# Patient Record
Sex: Male | Born: 1940 | Race: White | Hispanic: No | Marital: Single | Smoking: Current every day smoker
Health system: Southern US, Community
[De-identification: ages and names within clinical notes are randomized; demographics above are authoritative.]

## PROBLEM LIST (undated history)

## (undated) DIAGNOSIS — I639 Cerebral infarction, unspecified: Secondary | ICD-10-CM

---

## 2013-03-31 ENCOUNTER — Encounter (HOSPITAL_COMMUNITY): Payer: Self-pay | Admitting: Emergency Medicine

## 2013-03-31 ENCOUNTER — Emergency Department (HOSPITAL_COMMUNITY)
Admission: EM | Admit: 2013-03-31 | Discharge: 2013-03-31 | Disposition: A | Discharge status: Home / Self Care | Payer: Medicare Other | Attending: Emergency Medicine | Admitting: Emergency Medicine

## 2013-03-31 DIAGNOSIS — K029 Dental caries, unspecified: Secondary | ICD-10-CM | POA: Insufficient documentation

## 2013-03-31 DIAGNOSIS — K089 Disorder of teeth and supporting structures, unspecified: Secondary | ICD-10-CM | POA: Insufficient documentation

## 2013-03-31 DIAGNOSIS — F17200 Nicotine dependence, unspecified, uncomplicated: Secondary | ICD-10-CM | POA: Insufficient documentation

## 2013-03-31 DIAGNOSIS — K0889 Other specified disorders of teeth and supporting structures: Secondary | ICD-10-CM

## 2013-03-31 MED ORDER — AMOXICILLIN 500 MG PO CAPS: 500.0000 mg | ORAL_CAPSULE | Freq: Three times a day (TID) | ORAL | Status: DC

## 2013-03-31 NOTE — ED Provider Notes (Signed)
CSN: [id number]     Arrival date & time 03/31/13  1501 History  This chart was scribed for non-physician practitioner, Marlon Peliffany Jayleana Colberg, PA-C working with Rolland PorterMark James, MD by Greggory StallionKayla Andersen, ED scribe. This patient was seen in room WTR5/WTR5 and the patient's care was started at 5:09 PM.   Chief Complaint  Patient presents with  . Dental Pain   The history is provided by the patient. No language interpreter was used.   HPI Comments: Jim Payne is a 73 y.o. male who presents to the Emergency Department complaining of gradual onset, constant left upper dental pain that started 3 days ago. He states he has an abscessed tooth and history of the same. Pt states antibiotics normally get rid of the abscess and pain. He went to the pharmacy to get his prescription that helped him last time and requests this medication: Amoxicillin 500mg  tabs.  Denies fever, weakness, emesis, trouble swallowing.   History reviewed. No pertinent past medical history. History reviewed. No pertinent past surgical history. History reviewed. No pertinent family history. History  Substance Use Topics  . Smoking status: Current Every Day Smoker  . Smokeless tobacco: Not on file  . Alcohol Use: Yes    Review of Systems  Constitutional: Negative for fever.  HENT: Positive for dental problem. Negative for trouble swallowing.   Gastrointestinal: Negative for vomiting.  Neurological: Negative for weakness.  All other systems reviewed and are negative.    Allergies  Review of patient's allergies indicates not on file.  Home Medications   Current Outpatient Rx  Name  Route  Sig  Dispense  Refill  . amoxicillin (AMOXIL) 500 MG capsule   Oral   Take 1 capsule (500 mg total) by mouth 3 (three) times daily.   21 capsule   0     BP 137/65  Pulse 85  Temp(Src) 98.1 F (36.7 C) (Oral)  Resp 16  SpO2 99%  Physical Exam  Nursing note and vitals reviewed. Constitutional: He is oriented to person, place, and time.  He appears well-developed and well-nourished. No distress.  HENT:  Head: Normocephalic and atraumatic.  Mouth/Throat: Dental caries present.    Widespread dental decay. No obvious abscess.   Eyes: Conjunctivae and EOM are normal. Pupils are equal, round, and reactive to light.  Neck: Normal range of motion. Neck supple. No tracheal deviation present.  Cardiovascular: Normal rate and regular rhythm.   Pulmonary/Chest: Effort normal and breath sounds normal. No respiratory distress.  Musculoskeletal: Normal range of motion.  Neurological: He is alert and oriented to person, place, and time.  Skin: Skin is warm and dry.  Psychiatric: He has a normal mood and affect. His behavior is normal.    ED Course  Procedures (including critical care time)  DIAGNOSTIC STUDIES: Oxygen Saturation is 99% on RA, normal by my interpretation.    COORDINATION OF CARE: 5:11 PM-Discussed treatment plan which includes an antibiotic with pt at bedside and pt agreed to plan.   Labs Review Labs Reviewed - No data to display Imaging Review No results found.  EKG Interpretation   None       MDM   1. Toothache    Patient has dental pain. No emergent s/sx's present. Patent airway. No trismus.  Will be given  antibiotics. I discussed the need to call dentist within 24/48 hours for follow-up. Dental referral given. Return to ED precautions given.  Pt voiced understanding and has agreed to follow-up.   73 y.o.Jim Payne's evaluation in the  Emergency Department is complete. It has been determined that no acute conditions requiring further emergency intervention are present at this time. The patient/guardian have been advised of the diagnosis and plan. We have discussed signs and symptoms that warrant return to the ED, such as changes or worsening in symptoms.  Vital signs are stable at discharge. Filed Vitals:   03/31/13 1511  BP: 137/65  Pulse: 85  Temp: 98.1 F (36.7 C)  Resp: 16     Patient/guardian has voiced understanding and agreed to follow-up with the PCP or specialist.  I personally performed the services described in this documentation, which was scribed in my presence. The recorded information has been reviewed and is accurate.    Dorthula Matas, PA-C 03/31/13 1752

## 2013-03-31 NOTE — ED Notes (Signed)
Pt states he has had a dental abscess for past 3 days. Pt states he has had abscess in same tooth before. Pt has dental carie on L upper tooth.

## 2013-03-31 NOTE — ED Notes (Signed)
Patient is here with c/o tooth abscess in left upper tooth. Patient states he was treated with antbiotics recently. Patient has med bottles and list of what the pharmacy at [facility] told him to ask for. Patient states he has not been to dentist because he cant afford it.

## 2013-03-31 NOTE — Discharge Instructions (Signed)

## 2013-04-10 NOTE — ED Provider Notes (Signed)
Medical screening examination/treatment/procedure(s) were performed by non-physician practitioner and as supervising physician I was immediately available for consultation/collaboration.  EKG Interpretation   None         Amilia Vandenbrink, MD 04/10/13 0020 

## 2016-11-23 ENCOUNTER — Inpatient Hospital Stay (HOSPITAL_COMMUNITY)
Admission: EM | Admit: 2016-11-23 | Discharge: 2016-11-25 | DRG: 066 | Disposition: A | Discharge status: Home / Self Care | Payer: Non-veteran care | Attending: Internal Medicine | Admitting: Internal Medicine

## 2016-11-23 ENCOUNTER — Emergency Department (HOSPITAL_COMMUNITY): Payer: Non-veteran care

## 2016-11-23 ENCOUNTER — Inpatient Hospital Stay (HOSPITAL_COMMUNITY): Payer: Non-veteran care

## 2016-11-23 ENCOUNTER — Encounter (HOSPITAL_COMMUNITY): Payer: Self-pay | Admitting: *Deleted

## 2016-11-23 DIAGNOSIS — R29700 NIHSS score 0: Secondary | ICD-10-CM | POA: Diagnosis present

## 2016-11-23 DIAGNOSIS — I63332 Cerebral infarction due to thrombosis of left posterior cerebral artery: Secondary | ICD-10-CM | POA: Diagnosis not present

## 2016-11-23 DIAGNOSIS — R0781 Pleurodynia: Secondary | ICD-10-CM | POA: Diagnosis present

## 2016-11-23 DIAGNOSIS — I63212 Cerebral infarction due to unspecified occlusion or stenosis of left vertebral arteries: Secondary | ICD-10-CM

## 2016-11-23 DIAGNOSIS — I6522 Occlusion and stenosis of left carotid artery: Secondary | ICD-10-CM | POA: Diagnosis present

## 2016-11-23 DIAGNOSIS — G319 Degenerative disease of nervous system, unspecified: Secondary | ICD-10-CM | POA: Diagnosis present

## 2016-11-23 DIAGNOSIS — R200 Anesthesia of skin: Secondary | ICD-10-CM

## 2016-11-23 DIAGNOSIS — I739 Peripheral vascular disease, unspecified: Secondary | ICD-10-CM | POA: Diagnosis present

## 2016-11-23 DIAGNOSIS — R9082 White matter disease, unspecified: Secondary | ICD-10-CM | POA: Diagnosis present

## 2016-11-23 DIAGNOSIS — I63312 Cerebral infarction due to thrombosis of left middle cerebral artery: Secondary | ICD-10-CM

## 2016-11-23 DIAGNOSIS — I10 Essential (primary) hypertension: Secondary | ICD-10-CM | POA: Diagnosis present

## 2016-11-23 DIAGNOSIS — R079 Chest pain, unspecified: Secondary | ICD-10-CM

## 2016-11-23 DIAGNOSIS — I5030 Unspecified diastolic (congestive) heart failure: Secondary | ICD-10-CM | POA: Diagnosis not present

## 2016-11-23 DIAGNOSIS — R911 Solitary pulmonary nodule: Secondary | ICD-10-CM | POA: Diagnosis present

## 2016-11-23 DIAGNOSIS — F1021 Alcohol dependence, in remission: Secondary | ICD-10-CM | POA: Diagnosis present

## 2016-11-23 DIAGNOSIS — F17200 Nicotine dependence, unspecified, uncomplicated: Secondary | ICD-10-CM | POA: Diagnosis present

## 2016-11-23 DIAGNOSIS — I6381 Other cerebral infarction due to occlusion or stenosis of small artery: Secondary | ICD-10-CM

## 2016-11-23 DIAGNOSIS — I638 Other cerebral infarction: Secondary | ICD-10-CM | POA: Diagnosis present

## 2016-11-23 DIAGNOSIS — I6523 Occlusion and stenosis of bilateral carotid arteries: Secondary | ICD-10-CM | POA: Diagnosis not present

## 2016-11-23 DIAGNOSIS — I6389 Other cerebral infarction: Secondary | ICD-10-CM

## 2016-11-23 DIAGNOSIS — F1410 Cocaine abuse, uncomplicated: Secondary | ICD-10-CM | POA: Diagnosis present

## 2016-11-23 DIAGNOSIS — I6322 Cerebral infarction due to unspecified occlusion or stenosis of basilar arteries: Secondary | ICD-10-CM

## 2016-11-23 DIAGNOSIS — E785 Hyperlipidemia, unspecified: Secondary | ICD-10-CM | POA: Diagnosis present

## 2016-11-23 DIAGNOSIS — Z720 Tobacco use: Secondary | ICD-10-CM | POA: Diagnosis not present

## 2016-11-23 DIAGNOSIS — I639 Cerebral infarction, unspecified: Secondary | ICD-10-CM | POA: Diagnosis present

## 2016-11-23 LAB — APTT: aPTT: 33 seconds (ref 24–36)

## 2016-11-23 LAB — COMPREHENSIVE METABOLIC PANEL
ALT: 13 U/L — AB (ref 17–63)
AST: 22 U/L (ref 15–41)
Albumin: 4.1 g/dL (ref 3.5–5.0)
Alkaline Phosphatase: 62 U/L (ref 38–126)
Anion gap: 8 (ref 5–15)
BUN: 12 mg/dL (ref 6–20)
CHLORIDE: 104 mmol/L (ref 101–111)
CO2: 26 mmol/L (ref 22–32)
Calcium: 9.1 mg/dL (ref 8.9–10.3)
Creatinine, Ser: 1.18 mg/dL (ref 0.61–1.24)
GFR, EST NON AFRICAN AMERICAN: 59 mL/min — AB (ref >60)
Glucose, Bld: 112 mg/dL — ABNORMAL HIGH (ref 65–99)
Potassium: 4.6 mmol/L (ref 3.5–5.1)
Sodium: 138 mmol/L (ref 135–145)
Total Bilirubin: 0.8 mg/dL (ref 0.3–1.2)
Total Protein: 6.9 g/dL (ref 6.5–8.1)

## 2016-11-23 LAB — I-STAT CHEM 8, ED
BUN: 15 mg/dL (ref 6–20)
CREATININE: 1.1 mg/dL (ref 0.61–1.24)
Calcium, Ion: 1.1 mmol/L — ABNORMAL LOW (ref 1.15–1.40)
Chloride: 101 mmol/L (ref 101–111)
GLUCOSE: 107 mg/dL — AB (ref 65–99)
HCT: 46 % (ref 39.0–52.0)
HEMOGLOBIN: 15.6 g/dL (ref 13.0–17.0)
POTASSIUM: 4.6 mmol/L (ref 3.5–5.1)
Sodium: 140 mmol/L (ref 135–145)
TCO2: 28 mmol/L (ref 22–32)

## 2016-11-23 LAB — PROTIME-INR
INR: 0.91
Prothrombin Time: 12.2 seconds (ref 11.4–15.2)

## 2016-11-23 LAB — CBC
HEMATOCRIT: 44.8 % (ref 39.0–52.0)
HEMOGLOBIN: 14.3 g/dL (ref 13.0–17.0)
MCH: 27.6 pg (ref 26.0–34.0)
MCHC: 31.9 g/dL (ref 30.0–36.0)
MCV: 86.3 fL (ref 78.0–100.0)
Platelets: 202 10*3/uL (ref 150–400)
RBC: 5.19 MIL/uL (ref 4.22–5.81)
RDW: 14.3 % (ref 11.5–15.5)
WBC: 6.8 10*3/uL (ref 4.0–10.5)

## 2016-11-23 LAB — DIFFERENTIAL
BASOS PCT: 0 %
Basophils Absolute: 0 10*3/uL (ref 0.0–0.1)
Eosinophils Absolute: 0.1 10*3/uL (ref 0.0–0.7)
Eosinophils Relative: 1 %
LYMPHS ABS: 2.2 10*3/uL (ref 0.7–4.0)
Lymphocytes Relative: 32 %
MONO ABS: 0.4 10*3/uL (ref 0.1–1.0)
MONOS PCT: 6 %
Neutro Abs: 4.1 10*3/uL (ref 1.7–7.7)
Neutrophils Relative %: 61 %

## 2016-11-23 LAB — I-STAT TROPONIN, ED: TROPONIN I, POC: 0.01 ng/mL (ref 0.00–0.08)

## 2016-11-23 MED ORDER — SENNOSIDES-DOCUSATE SODIUM 8.6-50 MG PO TABS: 1.0000 | ORAL_TABLET | Freq: Every evening | ORAL | Status: DC | PRN

## 2016-11-23 MED ORDER — ASPIRIN 325 MG PO TABS: 325.0000 mg | ORAL_TABLET | Freq: Every day | ORAL | Status: DC

## 2016-11-23 MED ORDER — ASPIRIN 325 MG PO TABS
325.0000 mg | ORAL_TABLET | Freq: Every day | ORAL | Status: DC
  Administered 2016-11-24 – 2016-11-25 (×2): 325 mg via ORAL
  Filled 2016-11-23 (×2): qty 1

## 2016-11-23 MED ORDER — ACETAMINOPHEN 650 MG RE SUPP: 650.0000 mg | RECTAL | Status: DC | PRN

## 2016-11-23 MED ORDER — ENOXAPARIN SODIUM 40 MG/0.4ML ~~LOC~~ SOLN
40.0000 mg | SUBCUTANEOUS | Status: DC
  Administered 2016-11-23 – 2016-11-24 (×2): 40 mg via SUBCUTANEOUS
  Filled 2016-11-23 (×2): qty 0.4

## 2016-11-23 MED ORDER — ASPIRIN 300 MG RE SUPP: 300.0000 mg | Freq: Every day | RECTAL | Status: DC

## 2016-11-23 MED ORDER — STROKE: EARLY STAGES OF RECOVERY BOOK
Freq: Once | Status: DC
  Filled 2016-11-23: qty 1

## 2016-11-23 MED ORDER — ACETAMINOPHEN 325 MG PO TABS: 650.0000 mg | ORAL_TABLET | ORAL | Status: DC | PRN

## 2016-11-23 MED ORDER — ATORVASTATIN CALCIUM 80 MG PO TABS
80.0000 mg | ORAL_TABLET | Freq: Every day | ORAL | Status: DC
  Administered 2016-11-24 – 2016-11-25 (×2): 80 mg via ORAL
  Filled 2016-11-23 (×2): qty 1

## 2016-11-23 MED ORDER — ACETAMINOPHEN 160 MG/5ML PO SOLN: 650.0000 mg | ORAL | Status: DC | PRN

## 2016-11-23 NOTE — ED Notes (Signed)
ED Provider at bedside. 

## 2016-11-23 NOTE — ED Provider Notes (Signed)
[facility]EMERGENCY DEPT Provider Note   CSN: [id number] Arrival date & time: 11/23/16  1019     History   Chief Complaint Chief Complaint  Patient presents with  . Numbness    HPI Jim Payne is a 76 y.o. male.  The history is provided by the patient and medical records.     76 year old male here with numbness. States he first noticed this on Sunday, mostly along the right side of the face, right arm, and right leg. States it has not gotten any better but is also not gotten any worse. He denies any focal weakness, trouble walking, changes in speech, blurred vision, headache, or neck pain. States he recently started a new job and thought this was causing his symptoms as he has been moving around a lot more during the day than normal.  He denies any falls or head trauma. Not currently on anticoagulation.  He denies any significant headache or dizziness. No prior history of TIA or stroke.  States he had a CT scan done this morning at the [facility] hospital that said there was "blood" and he was sent here for further management.  History reviewed. No pertinent past medical history.  There are no active problems to display for this patient.   History reviewed. No pertinent surgical history.     Home Medications    Prior to Admission medications   Medication Sig Start Date End Date Taking? Authorizing Provider  amoxicillin (AMOXIL) 500 MG capsule Take 1 capsule (500 mg total) by mouth 3 (three) times daily. 03/31/13   Marlon Pel, PA-C    Family History History reviewed. No pertinent family history.  Social History Social History  Substance Use Topics  . Smoking status: Current Every Day Smoker  . Smokeless tobacco: Not on file  . Alcohol use Yes     Allergies   Patient has no known allergies.   Review of Systems Review of Systems  Neurological: Positive for numbness.  All other systems reviewed and are negative.    Physical Exam Updated Vital Signs BP (!) 163/107    Pulse 83   Temp 97.9 F (36.6 C) (Oral)   Resp 16   SpO2 100%   Physical Exam  Constitutional: He is oriented to person, place, and time. He appears well-developed and well-nourished.  HENT:  Head: Normocephalic and atraumatic.  Mouth/Throat: Oropharynx is clear and moist.  Eyes: Pupils are equal, round, and reactive to light. Conjunctivae and EOM are normal.  Neck: Normal range of motion.  Cardiovascular: Normal rate, regular rhythm and normal heart sounds.   Pulmonary/Chest: Effort normal and breath sounds normal. No respiratory distress. He has no wheezes.  Abdominal: Soft. Bowel sounds are normal. There is no tenderness. There is no rebound.  Musculoskeletal: Normal range of motion.  No cervical spine tenderness, no step-offs or deformities  Neurological: He is alert and oriented to person, place, and time.  AAOx3, answering questions and following commands appropriately; equal strength UE and LE bilaterally; CN grossly intact; moves all extremities appropriately without ataxia; decreased sensation of right cheek; right lateral forearm, right 4th and 5th fingers, right lateral thigh and right anterior ankle; normal sensation throughout left side; normal heel to shin, no pronator drift; no focal neuro deficits or facial asymmetry appreciated  Skin: Skin is warm and dry.  Psychiatric: He has a normal mood and affect.  Nursing note and vitals reviewed.    ED Treatments / Results  Labs (all labs ordered are listed, but only abnormal  results are displayed) Labs Reviewed  COMPREHENSIVE METABOLIC PANEL - Abnormal; Notable for the following:       Result Value   Glucose, Bld 112 (*)    ALT 13 (*)    GFR calc non Af Amer 59 (*)    All other components within normal limits  I-STAT CHEM 8, ED - Abnormal; Notable for the following:    Glucose, Bld 107 (*)    Calcium, Ion 1.10 (*)    All other components within normal limits  PROTIME-INR  APTT  CBC  DIFFERENTIAL  I-STAT TROPONIN,  ED  CBG MONITORING, ED    EKG  EKG Interpretation  Date/Time:  Wednesday November 23 2016 10:47:49 EDT Ventricular Rate:  85 PR Interval:  156 QRS Duration: 90 QT Interval:  370 QTC Calculation: 440 R Axis:   108 Text Interpretation:  Normal sinus rhythm Rightward axis Anterior infarct , age undetermined Abnormal ECG no pervious EKG  no acute ischemic changes  Confirmed by Crista CurbLiu, Dana [phone]([id number]) on 11/23/2016 12:18:20 PM       Radiology Ct Head Wo Contrast  Result Date: 11/23/2016 CLINICAL DATA:  Right arm numbness and tingling. EXAM: CT HEAD WITHOUT CONTRAST TECHNIQUE: Contiguous axial images were obtained from the base of the skull through the vertex without intravenous contrast. COMPARISON:  None. FINDINGS: Brain: No evidence of acute infarction, hemorrhage, hydrocephalus, extra-axial collection or mass lesion/mass effect. Bilateral basal ganglia lacunar infarcts. Age-related cerebral atrophy with compensatory dilatation of the ventricles. Periventricular white matter and corona radiata hypodensities favor chronic ischemic microvascular white matter disease. Vascular: Atherosclerotic vascular calcification of the carotid siphons. No hyperdense vessel. Skull:  No fracture or focal lesion. Sinuses/Orbits: The bilateral paranasal sinuses and mastoid air cells are clear. The orbits are unremarkable. Other: None. IMPRESSION: 1.  No acute intracranial abnormality. 2. 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 Electronically Signed   By: Obie DredgeWilliam T Derry M.D.   On: 11/23/2016 12:51    Procedures Procedures (including critical care time)  Medications Ordered in ED Medications - No data to display   Initial Impression / Assessment and Plan / ED Course  I have reviewed the triage vital signs and the nursing notes.  Pertinent labs & imaging results that were available during my care of the patient were reviewed by me and considered in my medical decision making  (see chart for details).  76 year old male here with numbness of his right side is been ongoing for 3 days now.  No worsening or improvement since onset.  He is afebrile and nontoxic. Does have some decreased sensation of the right cheek, right lateral forearm, right fourth and fifth digits, right lateral thigh, and right anterior ankle on exam. No other deficits noted. No ataxia.  Reports he had a [facility] this [facility] which showed a "bleed". Unfortunately, we cannot see these results results and he has no hard copies with him. Will need to repeat his head CT immediately.  1:33 PM Patient's head CT is negative for acute findings. Does have some chronic microvascular ischemia.  Labs are overall reassuring. Discussed results with patient, his knowledge understanding. Will obtain MRI. If negative, I feel he can be discharged home.  3:11 PM Patient awaiting MRI.  If negative, feel he can be discharged home to follow-up with PCP.  Patient seen and evaluated with attending physician, Dr. Verdie MosherLiu, who evaluated patient and agrees with assessment and plan of care.  Final Clinical Impressions(s) / ED  Diagnoses   Final diagnoses:  Numbness    New Prescriptions New Prescriptions   No medications on file     Garlon Hatchet, PA-C 11/23/16 1511    Lavera Guise, MD 11/23/16 [phone]

## 2016-11-23 NOTE — ED Triage Notes (Signed)
Pt reports having right side numbness since Saturday. [facility] today and had ct scan done, was told it was + for a stroke or bleeding, pt unsure of details and sent here for further eval and treatment. No acute distress is noted at triage.

## 2016-11-23 NOTE — ED Notes (Signed)
Pt ambulated to room from triage with NAD and steady gait. Pt reports having numbness on left side of face, arm, body, and leg since Saturday with no change. Pt denies weakness or dropping objects.

## 2016-11-23 NOTE — ED Notes (Addendum)
Pt reports he wants to go outside for some "fresh air" EDP made aware of pt attempting to leave.

## 2016-11-23 NOTE — H&P (Addendum)
History and Physical   Delois Tolbert ZOX:[id number] DOB: [birth year] DOA: 11/23/2016  PCP: System, Pcp Not In  Chief Complaint: Right-sided numbness  HPI: This 76 year old man reports that on 11-21-2016 he developed numbness involving his right side of his face and face his right fourth and fifth digit, his right lateral arm, and right distal lateral leg numbness. The symptoms have been constant, not aggravated or alleviated by anything. He denies any difficulty speaking, no difficulty moving any of his extremities, no difficulty swallowing. He reports a nonproductive cough over the past week or so, he has a longtime smoker with a 30-40-pack-year history of smoking. He reports never having any TIAs or strokes in the past, he presented to the [place] and according to his report was told that he had a stroke based on a CT scan.  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 She denies any hemoptysis, fevers, chest pain. He reports his only hospitalization has been an inguinal hernia repair of the left side.  ED Course: In the emergency department also has remarkable for elevated systolic blood pressures ranging from 155 to 170s, diastolics ranging in the 90s, respiratory rate of 23. BMP and CBC were unremarkable. He had a CT of the head performed which did not reveal any acute bleed, MRI of the brain revealed a subcentimeter acute infarction within the left lateral thalamus without acute hemorrhage. There is also report of moderate chronic microvascular ischemic changes and parenchymal volume loss the brain.  Review of Systems: A complete ROS was obtained; pertinent positives negatives are denoted in the HPI. Otherwise, all systems are negative.   PMH: -INGUINAL HERNIA REPAIR -SMOKING -PRIOR ALCOHOL AB USE  Social History   Social History  . Marital status: Single    Spouse name: N/A  . Number of children: N/A  . Years of  education: N/A   Occupational History  . Not on file.   Social History Main Topics  . Smoking status: Current Every Day Smoker  . Smokeless tobacco: Not on file  . Alcohol use Yes  . Drug use: No  . Sexual activity: Not on file   Other Topics Concern  . Not on file   Social History Narrative  . No narrative on file   Family hx: Mother died at age 28 of old age, father died at age 56 yo of cirrhosis.  Physical Exam: Vitals:   11/23/16 2000 11/23/16 2045 11/23/16 2130 11/23/16 2214  BP: (!) 170/96 (!) 154/94 (!) 125/97 (!) 183/86  Pulse: 77 71 72 76  Resp:    20  Temp:    98.6 F (37 C)  TempSrc:    Oral  SpO2: 98% 95% 94% 98%  Weight:    72 kg (158 lb 11.2 oz)  Height:    6' (1.829 m)   General: Appears calm and comfortable, white man. ENT: Grossly normal hearing, MMM. Cardiovascular: RRR. No M/R/G. No LE edema. Bruit present right carotid. Respiratory: CTA bilaterally. No wheezes or crackles. Normal respiratory effort.  Does report pain on palpation of right lower rib cage. Abdomen: Soft, non-tender. Bowel sounds present.  Skin: No rash or induration seen on limited exam. Musculoskeletal: Grossly normal tone BUE/BLE. Appropriate ROM.  Psychiatric: Grossly normal mood and affect. Neurologic: Moves all extremities in coordinated fashion, finger to nose testing WNL, no CN 2-12 gross abnormality, gross sensation intact to touch extremities x 4, great toe position sense intact  bilaterally  I have personally reviewed the following labs, culture data, and imaging studies.  Assessment/Plan:  -Acute left lateral thalamic stroke COURSE: presented with numbness of right face, distal right arm and right leg, MR revealed left lateral thalamic stroke, outside of tPA window PLAN:  -neurology consult following, appreciate their recommendations -carotid US of bilateral carotids, bruit was heard on right -TTE to evaluate cardiac etiology to CVA, also telemetry to assess for AF or  other arrhythmia predisposing to CVA -Neuro checks -Bedside nursing swallow assessment, can advance diet if he passes, speech language pathology consult -PT/OT -noted to be HTN, but not uncommon in setting of acute CVA to have concomitant HTN, will avoid initiating pharmacotherapy in acute CVA setting unless SBP > 200  -risk stratification labs pending: A1c and lipid profile - will start high intensity statin with atorvastatin -ASA for secondary prevention  Other problems -Right ribcage pain - uncertain etiology, no rib fx noted on CXR, continue to monitor, consider CT of chest wo contrast if pain persists -Smoking: provided smoking cessation counseling, he is pre-contemplative stage  DVT prophylaxis: Subq Lovenox Code Status: full code, discussed on day of admission Disposition Plan: Anticipate D/C home in 2-5 days Consults called: neurology Admission status: to hospitalist service  Laurell RoofPatrick Kuhlman, MD [facility] Page:[phone]  If 7PM-7AM, please contact night-coverage [email or web address] Password [email or web address]

## 2016-11-23 NOTE — ED Provider Notes (Signed)
Patient's out to me by previous provider to follow up on a brain MRI. This is a 76 year old male presenting with patchy numbness to the right side of his body including his right shoulder, right fourth and fifth finger, and the lateral aspect of his right leg ongoing for the past 3 days. Denies any vision changes, trouble speaking, confusion, chest pain, shortness of breath, trouble breathing, lightheadedness, dizziness. A brain on MRI was obtained which show an acute left thalamic ischemic stroke without any signs of hemorrhagic changes.  I discussed this finding with patient. Patient expressed desire to go home to feed his pets and then return. I spent a moderate amount time discussing risk of leaving against medical advice.  Pt agrees to stay.  Will consult neurology and medicine for admission for further stroke work up.    7:33 PM Appreciate consultation from oncall neurologist Dr. Laurence Slate who agrees with medicine to admit and he will see the pt.    8:15 PM Appreciate consultation from [facility] Dr. Rema Jasmine who agrees to see and admit pt for further care.    BP 135/85   Pulse 77   Temp 97.9 F (36.6 C) (Oral)   Resp 17   SpO2 98%    Results for orders placed or performed during the hospital encounter of 11/23/16  Protime-INR  Result Value Ref Range   Prothrombin Time 12.2 11.4 - 15.2 seconds   INR 0.91   APTT  Result Value Ref Range   aPTT 33 24 - 36 seconds  CBC  Result Value Ref Range   WBC 6.8 4.0 - 10.5 K/uL   RBC 5.19 4.22 - 5.81 MIL/uL   Hemoglobin 14.3 13.0 - 17.0 g/dL   HCT 16.1 09.6 - 04.5 %   MCV 86.3 78.0 - 100.0 fL   MCH 27.6 26.0 - 34.0 pg   MCHC 31.9 30.0 - 36.0 g/dL   RDW 40.9 81.1 - 91.4 %   Platelets 202 150 - 400 K/uL  Differential  Result Value Ref Range   Neutrophils Relative % 61 %   Neutro Abs 4.1 1.7 - 7.7 K/uL   Lymphocytes Relative 32 %   Lymphs Abs 2.2 0.7 - 4.0 K/uL   Monocytes Relative 6 %   Monocytes Absolute 0.4 0.1 - 1.0 K/uL    Eosinophils Relative 1 %   Eosinophils Absolute 0.1 0.0 - 0.7 K/uL   Basophils Relative 0 %   Basophils Absolute 0.0 0.0 - 0.1 K/uL  Comprehensive metabolic panel  Result Value Ref Range   Sodium 138 135 - 145 mmol/L   Potassium 4.6 3.5 - 5.1 mmol/L   Chloride 104 101 - 111 mmol/L   CO2 26 22 - 32 mmol/L   Glucose, Bld 112 (H) 65 - 99 mg/dL   BUN 12 6 - 20 mg/dL   Creatinine, Ser 7.82 0.61 - 1.24 mg/dL   Calcium 9.1 8.9 - 95.6 mg/dL   Total Protein 6.9 6.5 - 8.1 g/dL   Albumin 4.1 3.5 - 5.0 g/dL   AST 22 15 - 41 U/L   ALT 13 (L) 17 - 63 U/L   Alkaline Phosphatase 62 38 - 126 U/L   Total Bilirubin 0.8 0.3 - 1.2 mg/dL   GFR calc non Af Amer 59 (L) >60 mL/min   GFR calc Af Amer >60 >60 mL/min   Anion gap 8 5 - 15  I-stat troponin, ED  Result Value Ref Range   Troponin i, poc 0.01 0.00 - 0.08 ng/mL  Comment 3          I-Stat Chem 8, ED  Result Value Ref Range   Sodium 140 135 - 145 mmol/L   Potassium 4.6 3.5 - 5.1 mmol/L   Chloride 101 101 - 111 mmol/L   BUN 15 6 - 20 mg/dL   Creatinine, Ser 1.611.10 0.61 - 1.24 mg/dL   Glucose, Bld 096107 (H) 65 - 99 mg/dL   Calcium, Ion 0.451.10 (L) 1.15 - 1.40 mmol/L   TCO2 28 22 - 32 mmol/L   Hemoglobin 15.6 13.0 - 17.0 g/dL   HCT 40.946.0 81.139.0 - 91.452.0 %   Ct Head Wo Contrast  Result Date: 11/23/2016 CLINICAL DATA:  Right arm numbness and tingling. EXAM: CT HEAD WITHOUT CONTRAST TECHNIQUE: Contiguous axial images were obtained from the base of the skull through the vertex without intravenous contrast. COMPARISON:  None. FINDINGS: Brain: No evidence of acute infarction, hemorrhage, hydrocephalus, extra-axial collection or mass lesion/mass effect. Bilateral basal ganglia lacunar infarcts. Age-related cerebral atrophy with compensatory dilatation of the ventricles. Periventricular white matter and corona radiata hypodensities favor chronic ischemic microvascular white matter disease. Vascular: Atherosclerotic vascular calcification of the carotid siphons. No  hyperdense vessel. Skull:  No fracture or focal lesion. Sinuses/Orbits: The bilateral paranasal sinuses and mastoid air cells are clear. The orbits are unremarkable. Other: None. IMPRESSION: 1.  No acute intracranial abnormality. 2. 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 Electronically Signed   By: Obie DredgeWilliam T Derry M.D.   On: 11/23/2016 12:51   Mr Brain Wo Contrast  Result Date: 11/23/2016 CLINICAL DATA:  76 y/o M; numbness and tingling of the right face, arm, and leg. EXAM: MRI HEAD WITHOUT CONTRAST TECHNIQUE: Multiplanar, multiecho pulse sequences of the brain and surrounding structures were obtained without intravenous contrast. COMPARISON:  11/23/2016 CT of the head FINDINGS: Brain: Subcentimeter focus of reduced diffusion within the left lateral thalamus. Chronic lacunar infarct in right mid corona radiata. Moderate chronic microvascular ischemic changes of white matter. Moderate brain parenchymal volume loss. Prominent perivascular spaces within lentiform nuclei bilaterally. Small foci of susceptibility hypointensity are present within the left pons and right anterior temporal lobe compatible with hemosiderin deposition of old microhemorrhage. No extra-axial collection or effacement of basilar cisterns. Vascular: Normal flow voids. Skull and upper cervical spine: Normal marrow signal. Sinuses/Orbits: Negative. Other: None. IMPRESSION: 1. Subcentimeter acute infarction within left lateral thalamus. No acute hemorrhage. 2. Moderate chronic microvascular ischemic changes and parenchymal volume loss of the brain. These results were called by telephone at the time of interpretation on 11/23/2016 at 7:06 pm to PA [facility], who verbally acknowledged these results. Electronically Signed   By: Mitzi HansenLance  Furusawa-Stratton M.D.   On: 11/23/2016 19:14      Fayrene Helperran, Loryn Haacke, PA-C 11/23/16 2016    Rolland PorterJames, Mark, MD 12/05/16 [phone]

## 2016-11-23 NOTE — ED Notes (Signed)
Pt bed moved from 69M to [facility], attempted report

## 2016-11-23 NOTE — ED Notes (Signed)
Patient transported to X-ray 

## 2016-11-23 NOTE — ED Notes (Signed)
Attempted report 

## 2016-11-24 ENCOUNTER — Inpatient Hospital Stay (HOSPITAL_COMMUNITY): Payer: Non-veteran care

## 2016-11-24 ENCOUNTER — Encounter (HOSPITAL_COMMUNITY): Payer: Self-pay

## 2016-11-24 DIAGNOSIS — F17200 Nicotine dependence, unspecified, uncomplicated: Secondary | ICD-10-CM

## 2016-11-24 DIAGNOSIS — I6523 Occlusion and stenosis of bilateral carotid arteries: Secondary | ICD-10-CM

## 2016-11-24 DIAGNOSIS — I63332 Cerebral infarction due to thrombosis of left posterior cerebral artery: Secondary | ICD-10-CM

## 2016-11-24 DIAGNOSIS — Z720 Tobacco use: Secondary | ICD-10-CM

## 2016-11-24 DIAGNOSIS — E785 Hyperlipidemia, unspecified: Secondary | ICD-10-CM

## 2016-11-24 DIAGNOSIS — F1410 Cocaine abuse, uncomplicated: Secondary | ICD-10-CM

## 2016-11-24 DIAGNOSIS — I5030 Unspecified diastolic (congestive) heart failure: Secondary | ICD-10-CM

## 2016-11-24 LAB — BASIC METABOLIC PANEL
Anion gap: 6 (ref 5–15)
BUN: 11 mg/dL (ref 6–20)
CHLORIDE: 107 mmol/L (ref 101–111)
CO2: 28 mmol/L (ref 22–32)
CREATININE: 1.1 mg/dL (ref 0.61–1.24)
Calcium: 9 mg/dL (ref 8.9–10.3)
Glucose, Bld: 88 mg/dL (ref 65–99)
POTASSIUM: 4.1 mmol/L (ref 3.5–5.1)
SODIUM: 141 mmol/L (ref 135–145)

## 2016-11-24 LAB — HEMOGLOBIN A1C
HEMOGLOBIN A1C: 5.7 % — AB (ref 4.8–5.6)
MEAN PLASMA GLUCOSE: 116.89 mg/dL

## 2016-11-24 LAB — RAPID URINE DRUG SCREEN, HOSP PERFORMED
AMPHETAMINES: NOT DETECTED
BENZODIAZEPINES: NOT DETECTED
Barbiturates: NOT DETECTED
COCAINE: POSITIVE — AB
Opiates: NOT DETECTED
Tetrahydrocannabinol: NOT DETECTED

## 2016-11-24 LAB — LIPID PANEL
Cholesterol: 190 mg/dL (ref 0–200)
HDL: 40 mg/dL — AB (ref >40)
LDL Cholesterol: 131 mg/dL — ABNORMAL HIGH (ref 0–99)
TRIGLYCERIDES: 93 mg/dL (ref <150)
Total CHOL/HDL Ratio: 4.8 RATIO
VLDL: 19 mg/dL (ref 0–40)

## 2016-11-24 LAB — CBC
HCT: 43.7 % (ref 39.0–52.0)
Hemoglobin: 14.1 g/dL (ref 13.0–17.0)
MCH: 27.5 pg (ref 26.0–34.0)
MCHC: 32.3 g/dL (ref 30.0–36.0)
MCV: 85.4 fL (ref 78.0–100.0)
PLATELETS: 183 10*3/uL (ref 150–400)
RBC: 5.12 MIL/uL (ref 4.22–5.81)
RDW: 14 % (ref 11.5–15.5)
WBC: 6.8 10*3/uL (ref 4.0–10.5)

## 2016-11-24 LAB — ECHOCARDIOGRAM COMPLETE
Height: 72 in
WEIGHTICAEL: 2539.2 [oz_av]

## 2016-11-24 MED ORDER — CLOPIDOGREL BISULFATE 75 MG PO TABS
75.0000 mg | ORAL_TABLET | Freq: Every day | ORAL | Status: DC
Start: 2016-11-24 — End: 2016-11-25
  Administered 2016-11-24 – 2016-11-25 (×2): 75 mg via ORAL
  Filled 2016-11-24 (×2): qty 1

## 2016-11-24 MED ORDER — IOPAMIDOL (ISOVUE-370) INJECTION 76%
INTRAVENOUS | Status: AC
Start: 2016-11-24 — End: 2016-11-24
  Administered 2016-11-24: 50 mL
  Filled 2016-11-24: qty 50

## 2016-11-24 NOTE — Evaluation (Cosign Needed)
Speech Language Pathology Evaluation Patient Details Name: Jim EmoryWilliam Payne MRN: [id number] DOB: 1940-08-18 Today's Date: 11/24/2016 Time: [id number] SLP Time Calculation (min) (ACUTE ONLY): 19 min  Problem List:  Patient Active Problem List   Diagnosis Date Noted  . Stroke [facility](HCC) 11/23/2016   Past Medical History: History reviewed. No pertinent past medical history. Past Surgical History: History reviewed. No pertinent surgical history. HPI:  Pt is a 76 year old man that reports developed numbness involving his right side of his face and face his right fourth and fifth digit, his right lateral arm, and right distal lateral leg numbness. Symptoms have been constant, not aggravated or alleviated by anything. He reports a nonproductive cough. Hx of alcohol abuse and longtime smoker. MRI of the brain revealed a subcentimeter acute infarction within the left lateral thalamus without acute hemorrhage. There is also report of moderate chronic microvascular ischemic changes and parenchymal volume loss the brain.   Assessment / Plan / Recommendation Clinical Impression  Pt demonstrates speech and language within functional limits. No cognitive-linguistic deficits noted. Administered the WAB; pt scored 50/50. No SLP f/u needed, will sign off.     SLP Assessment  SLP Recommendation/Assessment: Patient does not need any further Speech Lanaguage Pathology Services SLP Visit Diagnosis: Cognitive communication deficit (R41.841)    Follow Up Recommendations  None    Frequency and Duration           SLP Evaluation Cognition  Overall Cognitive Status: Within Functional Limits for tasks assessed Arousal/Alertness: Awake/alert Orientation Level: Oriented X4       Comprehension  Auditory Comprehension Overall Auditory Comprehension: Appears within functional limits for tasks assessed Yes/No Questions: Within Functional Limits Commands: Within Functional Limits Visual  Recognition/Discrimination Discrimination: Within Function Limits    Expression Expression Primary Mode of Expression: Verbal Verbal Expression Overall Verbal Expression: Appears within functional limits for tasks assessed Written Expression Dominant Hand: Right   Oral / Motor  Motor Speech Overall Motor Speech: Appears within functional limits for tasks assessed   GO                    Carmela RimaAmanda Khiem Gargis, Student SLP 11/24/2016, 10:35 AM

## 2016-11-24 NOTE — Consult Note (Signed)
Requesting Physician: Dr Verdie Mosher    Chief Complaint: Left face arm and leg numbness  History obtained from:  Patient    HPI:                                                                                                                                       Jim Payne is an 76 y.o. male with past medical history of hypertension and current smoker with 50 year smoking history, prior history of alcohol abuse came to the emergency room for right-sided face arm and leg numbness and tingling. The patient felt the symptoms on 11/21/2016, however decided not to come to the ER. He presented to the [place] and was told to come to the emergency room for further evaluation. He had an MRI head at [facility] showed acute left thalamic infarct.  His blood pressures have been elevated to 170s while in the emergency room. He denies any other symptoms such as slurred speech, difficulty getting words out, weakness in arm or leg, blurring of vision or gait imbalance.  Date last known well: 8.27.18 tPA Given: No , outside window NIHSS 0 Modified Rankin:0   History reviewed. No pertinent past medical history.  History reviewed. No pertinent surgical history.  History reviewed. No pertinent family history. Social History:  reports that he has been smoking.  He does not have any smokeless tobacco history on file. He reports that he drinks alcohol. He reports that he does not use drugs. Is a recovering alcoholic  Allergies: No Known Allergies  Medications:                                                                                                                          Reviewed  ROS:  General ROS: negative for - chills, fatigue, fever, night sweats, weight gain or weight loss Psychological ROS: negative for - behavioral disorder, hallucinations, memory difficulties, mood  swings or suicidal ideation Ophthalmic ROS: negative for - blurry vision, double vision, eye pain or loss of vision ENT ROS: negative for - epistaxis, nasal discharge, oral lesions, sore throat, tinnitus or vertigo Allergy and Immunology ROS: negative for - hives or itchy/watery eyes Hematological and Lymphatic ROS: negative for - bleeding problems, bruising or swollen lymph nodes Endocrine ROS: negative for - galactorrhea, hair pattern changes, polydipsia/polyuria or temperature intolerance Respiratory ROS: negative for - cough, hemoptysis, shortness of breath or wheezing Cardiovascular ROS: negative for - chest pain, dyspnea on exertion, edema or irregular heartbeat Gastrointestinal ROS: negative for - abdominal pain, diarrhea, hematemesis, nausea/vomiting or stool incontinence Genito-Urinary ROS: negative for - dysuria, hematuria, incontinence or urinary frequency/urgency Musculoskeletal ROS: negative for - joint swelling or muscular weakness Neurological ROS: as noted in HPI Dermatological ROS: negative for rash and skin lesion changes   Examination:                                                                                                      General: Appears well-developed and well-nourished.  Psych: Affect appropriate to situation Eyes: No scleral injection HENT: No OP obstrucion Head: Normocephalic.  Cardiovascular: Normal rate and regular rhythm.  Respiratory: Effort normal and breath sounds normal to anterior ascultation GI: Soft.  No distension. There is no tenderness.  Skin: WDI   Neurological Examination Mental Status: Alert, oriented, thought content appropriate.  Speech fluent without evidence of aphasia.  Able to follow 3 step commands without difficulty. Cranial Nerves: II: Discs flat bilaterally; Visual fields grossly normal,  III,IV, VI: ptosis not present, extra-ocular motions intact bilaterally, pupils equal, round, reactive to light and  accommodation V,VII: smile symmetric, facial light touch sensation normal bilaterally VIII: hearing normal bilaterally IX,X: uvula rises symmetrically XI: bilateral shoulder shrug XII: midline tongue extension Motor: Right : Upper extremity   5/5    Left:     Upper extremity   5/5  Lower extremity   5/5     Lower extremity   5/5 Tone and bulk:normal tone throughout; no atrophy noted Sensory: Pinprick and light touch intact throughout, bilaterally, complains of paresthesias on the left face arm and leg Deep Tendon Reflexes: 2+ and symmetric throughout Plantars: Right: downgoing   Left: downgoing Cerebellar: normal finger-to-nose, normal rapid alternating movements and normal heel-to-shin test Gait: normal gait and station     Lab Results: Basic Metabolic Panel:  Recent Labs Lab 11/23/16 1029 11/23/16 1102  NA 138 140  K 4.6 4.6  CL 104 101  CO2 26  --   GLUCOSE 112* 107*  BUN 12 15  CREATININE 1.18 1.10  CALCIUM 9.1  --     CBC:  Recent Labs Lab 11/23/16 1029 11/23/16 1102  WBC 6.8  --   NEUTROABS 4.1  --   HGB 14.3 15.6  HCT 44.8 46.0  MCV 86.3  --   PLT 202  --  Coagulation Studies:  Recent Labs  11/23/16 1029  LABPROT 12.2  INR 0.91    Imaging: Dg Chest 2 View  Result Date: 11/23/2016 CLINICAL DATA:  Productive cough, un known chronicity. EXAM: CHEST  2 VIEW COMPARISON:  None. FINDINGS: Marked hyperinflation. Linear scarring or atelectasis in the bases. No confluent airspace consolidation. Heart size is normal. Hilar and mediastinal contours are unremarkable. Pulmonary vasculature is normal. No pleural effusions. IMPRESSION: Hyperinflation and moderate linear scarring or atelectasis in the bases. No consolidation or effusion. Electronically Signed   By: Ellery Plunkaniel R Mitchell M.D.   On: 11/23/2016 21:41   Ct Head Wo Contrast  Result Date: 11/23/2016 CLINICAL DATA:  Right arm numbness and tingling. EXAM: CT HEAD WITHOUT CONTRAST TECHNIQUE: Contiguous  axial images were obtained from the base of the skull through the vertex without intravenous contrast. COMPARISON:  None. FINDINGS: Brain: No evidence of acute infarction, hemorrhage, hydrocephalus, extra-axial collection or mass lesion/mass effect. Bilateral basal ganglia lacunar infarcts. Age-related cerebral atrophy with compensatory dilatation of the ventricles. Periventricular white matter and corona radiata hypodensities favor chronic ischemic microvascular white matter disease. Vascular: Atherosclerotic vascular calcification of the carotid siphons. No hyperdense vessel. Skull:  No fracture or focal lesion. Sinuses/Orbits: The bilateral paranasal sinuses and mastoid air cells are clear. The orbits are unremarkable. Other: None. IMPRESSION: 1.  No acute intracranial abnormality. 2. 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 Electronically Signed   By: Obie DredgeWilliam T Derry M.D.   On: 11/23/2016 12:51   Mr Brain Wo Contrast  Result Date: 11/23/2016 CLINICAL DATA:  76 y/o M; numbness and tingling of the right face, arm, and leg. EXAM: MRI HEAD WITHOUT CONTRAST TECHNIQUE: Multiplanar, multiecho pulse sequences of the brain and surrounding structures were obtained without intravenous contrast. COMPARISON:  11/23/2016 CT of the head FINDINGS: Brain: Subcentimeter focus of reduced diffusion within the left lateral thalamus. Chronic lacunar infarct in right mid corona radiata. Moderate chronic microvascular ischemic changes of white matter. Moderate brain parenchymal volume loss. Prominent perivascular spaces within lentiform nuclei bilaterally. Small foci of susceptibility hypointensity are present within the left pons and right anterior temporal lobe compatible with hemosiderin deposition of old microhemorrhage. No extra-axial collection or effacement of basilar cisterns. Vascular: Normal flow voids. Skull and upper cervical spine: Normal marrow signal. Sinuses/Orbits: Negative.  Other: None. IMPRESSION: 1. Subcentimeter acute infarction within left lateral thalamus. No acute hemorrhage. 2. Moderate chronic microvascular ischemic changes and parenchymal volume loss of the brain. These results were called by telephone at the time of interpretation on 11/23/2016 at 7:06 pm to PA [facility], who verbally acknowledged these results. Electronically Signed   By: Mitzi HansenLance  Furusawa-Stratton M.D.   On: 11/23/2016 19:14     ASSESSMENT AND PLAN   # Acute Ischemic Stroke    Left thalamic infarct Uncontrolled hypertension Current tobacco abuse  Risk factors; HTN, smoking  Etiology:  Small vessel disease  Recommend #MRA Head and neck  #Transthoracic Echo  # Start patient on ASA 325 mg daily  #Start or continue Atorvastatin 40 mg/other high intensity statin # BP goal: permissive HTN upto 210 systolic, PRNs above 21 # HBAIC and Lipid profile # Telemetry monitoring # Frequent neuro checks # NPO until passes stroke swallow screen # Tobacco cessation counseling   Please page stroke NP  Or  PA  Or MD from 8am -4 pm  as this patient from this time will be  followed by the stroke.   You can look them up on [email or web address]  Password [email or web address]  I spent over 25 minutes counseling regarding tobacco cessation, blood pressure management and lifestyle modification in order to prevent another stroke from occurring. I spent a total of 55 minutes involved in the care of this patient.   Georgiana Spinner Aroor MD [facility] [phone]  If 7pm to 7am, please call on call as listed on AMION.

## 2016-11-24 NOTE — Discharge Instructions (Signed)
Preventing High Cholesterol Cholesterol is a waxy, fat-like substance that your body needs in small amounts. Your liver makes all the cholesterol that your body needs. Having high cholesterol (hypercholesterolemia) increases your risk for heart disease and stroke. Extra (excess) cholesterol comes from the food you eat, such as animal-based fat (saturated fat) from meat and some dairy products. High cholesterol can often be prevented with diet and lifestyle changes. If you already have high cholesterol, you can control it with diet and lifestyle changes, as well as medicine. What nutrition changes can be made?  Eat less saturated fat. Foods that contain saturated fat include red meat and some dairy products.  Avoid processed meats, like bacon and lunch meats.  Avoid trans fats, which are found in margarine and some baked goods.  Avoid foods and beverages that have added sugars.  Eat more fruits, vegetables, and whole grains.  Choose healthy sources of protein, such as fish, poultry, and nuts.  Choose healthy sources of fat, such as: ? Nuts. ? Vegetable oils, especially olive oil. ? Fish that have healthy fats (omega-3 fatty acids), such as mackerel or salmon. What lifestyle changes can be made?  Lose weight if you are overweight. Losing 5-10 lb (2.3-4.5 kg) can help prevent or control high cholesterol and reduce your risk for diabetes and high blood pressure. Ask your health care provider to help you with a diet and exercise plan to safely lose weight.  Get enough exercise. Do at least 150 minutes of moderate-intensity exercise each week. ? You could do this in short exercise sessions several times a day, or you could do longer exercise sessions a few times a week. For example, you could take a brisk 10-minute walk or bike ride, 3 times a day, for 5 days a week.  Do not smoke. If you need help quitting, ask your health care provider.  Limit your alcohol intake. If you drink alcohol,  limit alcohol intake to no more than 1 drink a day for nonpregnant women and 2 drinks a day for men. One drink equals 12 oz of beer, 5 oz of wine, or 1 oz of hard liquor. Why are these changes important? If you have high cholesterol, deposits (plaques) may build up on the walls of your blood vessels. Plaques make the arteries narrower and stiffer, which can restrict or block blood flow and cause blood clots to form. This greatly increases your risk for heart attack and stroke. Making diet and lifestyle changes can reduce your risk for these life-threatening conditions. What can I do to lower my risk?  Manage your risk factors for high cholesterol. Talk with your health care provider about all of your risk factors and how to lower your risk.  Manage other conditions that you have, such as diabetes or high blood pressure (hypertension).  Have your cholesterol checked at regular intervals.  Keep all follow-up visits as told by your health care provider. This is important. How is this treated? In addition to diet and lifestyle changes, your health care provider may recommend medicines to help lower cholesterol, such as a medicine to reduce the amount of cholesterol made in your liver. You may need medicine if:  Diet and lifestyle changes do not lower your cholesterol enough.  You have high cholesterol and other risk factors for heart disease or stroke.  Take over-the-counter and prescription medicines only as told by your health care provider. Where to find more information:  American Heart Association: www.heart.org/HEARTORG/Conditions/Cholesterol/Cholesterol_UCM_001089_SubHomePage.jsp  National Heart, Lung, and   Blood Institute: www.nhlbi.nih.gov/health/resources/heart/heart-cholesterol-hbc-what-html Summary  High cholesterol increases your risk for heart disease and stroke. By keeping your cholesterol level low, you can reduce your risk for these conditions.  Diet and lifestyle changes  are the most important steps in preventing high cholesterol.  Work with your health care provider to manage your risk factors, and have your blood tested regularly. This information is not intended to replace advice given to you by your health care provider. Make sure you discuss any questions you have with your health care provider. Document Released: 03/29/2015 Document Revised: 11/21/2015 Document Reviewed: 11/21/2015 Elsevier Interactive Patient Education  2018 Elsevier Inc.  

## 2016-11-24 NOTE — Evaluation (Signed)
Physical Therapy Evaluation Patient Details Name: Jim Payne MRN: [id number] DOB: [birth year] Today's Date: 11/24/2016   History of Present Illness  Pt is a 76 y/o male admitted secondary to numbness involving his right side of his face and his right fourth and fifth digit, his right lateral arm, and right distal lateral leg numbness. 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 PMH including but not limited to ETOH abuse (in recovery),   Clinical Impression  Pt presented supine in bed with HOB elevated, awake and willing to participate in therapy session. Prior to admission, pt reported that he was independent with all functional mobility and ADLs. Pt ambulated in hallway with supervision for safety without use of an AD. Pt with modest instability but no overt LOB or need for physical assistance. Plan for stair training at next session. Pt would continue to benefit from skilled physical therapy services at this time while admitted and after d/c to address the below listed limitations in order to improve overall safety and independence with functional mobility.     Follow Up Recommendations Outpatient PT    Equipment Recommendations  None recommended by PT    Recommendations for Other Services       Precautions / Restrictions Precautions Precautions: Fall Restrictions Weight Bearing Restrictions: No      Mobility  Bed Mobility Overal bed mobility: Independent                Transfers Overall transfer level: Independent                  Ambulation/Gait Ambulation/Gait assistance: Supervision Ambulation Distance (Feet): 50 Feet Assistive device: None Gait Pattern/deviations: Step-through pattern;Decreased stride length Gait velocity: WFL Gait velocity interpretation: at or above normal speed for age/gender General Gait Details: modest instability (pt adamant about ambulating in his flip flops) but no overt LOB or need for physical  assistance  Stairs            Wheelchair Mobility    Modified Rankin (Stroke Patients Only) Modified Rankin (Stroke Patients Only) Pre-Morbid Rankin Score: No symptoms Modified Rankin: Slight disability     Balance Overall balance assessment: Needs assistance Sitting-balance support: Feet supported Sitting balance-Leahy Scale: Good     Standing balance support: No upper extremity supported;During functional activity Standing balance-Leahy Scale: Fair                               Pertinent Vitals/Pain Pain Assessment: No/denies pain Faces Pain Scale: No hurt    Home Living Family/patient expects to be discharged to:: Private residence Living Arrangements: Non-relatives/Friends Available Help at Discharge: Available 24 hours/day Type of Home: House Home Access: Stairs to enter Entrance Stairs-Rails: None Entrance Stairs-Number of Steps: 2 Home Layout: One level Home Equipment: Grab bars - toilet;Grab bars - tub/shower;Cane - single point      Prior Function Level of Independence: Independent               Hand Dominance   Dominant Hand: Right    Extremity/Trunk Assessment   Upper Extremity Assessment Upper Extremity Assessment: Defer to OT evaluation    Lower Extremity Assessment Lower Extremity Assessment: Overall WFL for tasks assessed       Communication   Communication: No difficulties  Cognition Arousal/Alertness: Awake/alert Behavior During Therapy: WFL for tasks assessed/performed Overall Cognitive Status: Within Functional Limits for tasks assessed  General Comments      Exercises     Assessment/Plan    PT Assessment Patient needs continued PT services  PT Problem List Decreased balance;Decreased mobility;Decreased coordination;Decreased safety awareness       PT Treatment Interventions DME instruction;Gait training;Stair training;Functional mobility  training;Therapeutic activities;Therapeutic exercise;Neuromuscular re-education;Balance training;Patient/family education    PT Goals (Current goals can be found in the Care Plan section)  Acute Rehab PT Goals Patient Stated Goal: return home PT Goal Formulation: With patient Time For Goal Achievement: 12/08/16 Potential to Achieve Goals: Good    Frequency Min 4X/week   Barriers to discharge        Co-evaluation               AM-PAC PT "6 Clicks" Daily Activity  Outcome Measure Difficulty turning over in bed (including adjusting bedclothes, sheets and blankets)?: None Difficulty moving from lying on back to sitting on the side of the bed? : None Difficulty sitting down on and standing up from a chair with arms (e.g., wheelchair, bedside commode, etc,.)?: None Help needed moving to and from a bed to chair (including a wheelchair)?: None Help needed walking in hospital room?: None Help needed climbing 3-5 steps with a railing? : A Little 6 Click Score: 23    End of Session   Activity Tolerance: Patient tolerated treatment well Patient left: in bed;Other (comment) (transport arriving to take pt for CT) Nurse Communication: Mobility status PT Visit Diagnosis: Unsteadiness on feet (R26.81);Other abnormalities of gait and mobility (R26.89)    Time: [id number] PT Time Calculation (min) (ACUTE ONLY): 10 min   Charges:   PT Evaluation $PT Eval Moderate Complexity: 1 Mod     PT G Codes:        IndustryJennifer Abena Erdman, PT, DPT [phone]   Jim BevelsJennifer M Tamura Lasky 11/24/2016, 12:43 PM

## 2016-11-24 NOTE — Progress Notes (Signed)
  Echocardiogram 2D Echocardiogram has been performed.  Jim Payne 11/24/2016, 2:53 PM

## 2016-11-24 NOTE — Progress Notes (Signed)
CM informed Building surveyorJennifer Mischler [facility]. [facility] at: [phone]. CM following

## 2016-11-24 NOTE — Progress Notes (Addendum)
STROKE TEAM PROGRESS NOTE   HISTORY OF PRESENT ILLNESS (per record) Jim Payne is an 76 y.o. male with past medical history of hypertension and current smoker with 50 year smoking history, prior history of alcohol abuse came to the emergency room for right-sided face arm and leg numbness and tingling. The patient felt the symptoms on 11/21/2016, however decided not to come to the ER. He presented to the [place] and was told to come to the emergency room for further evaluation. He had an MRI head at [facility] ER showed acute left thalamic infarct.  His blood pressures have been elevated to 170s while in the emergency room. He denies any other symptoms such as slurred speech, difficulty getting words out, weakness in arm or leg, blurring of vision or gait imbalance.  Date last known well: 8.27.18 tPA Given: No , outside window NIHSS 0 Modified Rankin:0   History reviewed. No pertinent past medical history.  History reviewed. No pertinent surgical history.  History reviewed. No pertinent family history. Social History:  reports that he has been smoking.  He does not have any smokeless tobacco history on file. He reports that he drinks alcohol. He reports that he does not use drugs. Is a recovering alcoholic  Allergies: No Known Allergies  Patient was not administered IV t-PA secondary to out of window.   SUBJECTIVE (INTERVAL HISTORY) Pt eating lunch. He says he currently smokes but does not drink alcohol. Is interested in the [facility] dental PREMIERS study. No other acute events overnight. Still feels some tingling at right arm and leg but no difference on tactile sensation.     OBJECTIVE Temp:  [97 F (36.1 C)-98.6 F (37 C)] 97.4 F (36.3 C) (08/30 0602) Pulse Rate:  [67-89] 68 (08/30 0602) Cardiac Rhythm: Normal sinus rhythm (08/29 2216) Resp:  [12-23] 18 (08/30 0602) BP: (110-183)/(68-107) 111/75 (08/30 0602) SpO2:  [94 %-100 %] 98 % (08/30 0602) Weight:  [72 kg (158 lb  11.2 oz)] 72 kg (158 lb 11.2 oz) (08/29 2214)  CBC:  Recent Labs Lab 11/23/16 1029 11/23/16 1102 11/24/16 0431  WBC 6.8  --  6.8  NEUTROABS 4.1  --   --   HGB 14.3 15.6 14.1  HCT 44.8 46.0 43.7  MCV 86.3  --  85.4  PLT 202  --  183    Basic Metabolic Panel:  Recent Labs Lab 11/23/16 1029 11/23/16 1102 11/24/16 0431  NA 138 140 141  K 4.6 4.6 4.1  CL 104 101 107  CO2 26  --  28  GLUCOSE 112* 107* 88  BUN 12 15 11   CREATININE 1.18 1.10 1.10  CALCIUM 9.1  --  9.0    Lipid Panel:    Component Value Date/Time   CHOL 190 11/24/2016 0429   TRIG 93 11/24/2016 0429   HDL 40 (L) 11/24/2016 0429   CHOLHDL 4.8 11/24/2016 0429   VLDL 19 11/24/2016 0429   LDLCALC 131 (H) 11/24/2016 0429   HgbA1c:  Lab Results  Component Value Date   HGBA1C 5.7 (H) 11/24/2016   Urine Drug Screen: No results found for: LABOPIA, COCAINSCRNUR, LABBENZ, AMPHETMU, THCU, LABBARB  Alcohol Level No results found for: [facility]  IMAGING I have personally reviewed the radiological images below and agree with the radiology interpretations.  Ct Head Wo Contrast 11/23/2016 IMPRESSION: 1.  No acute intracranial abnormality. 2. 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  Mr Brain Wo Contrast 11/23/2016 IMPRESSION: 1. Subcentimeter acute infarction within left  lateral thalamus. No acute hemorrhage. 2. Moderate chronic microvascular ischemic changes and parenchymal volume loss of the brain.   Ct Angio Head and neck W Or Wo Contrast 11/24/2016 IMPRESSION: 1. 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 2. Short segment atheromatous stenosis of up to 50% at the proximal right ICA. 3. No large vessel occlusion within the intracranial circulation. Moderate multifocal intracranial atheromatous disease as above. Most notable findings include short-segment moderate distal left M1 stenosis with moderate carotid siphon  atherosclerotic disease. 4. **An incidental finding of potential clinical significance has been found. 6 mm right lower lobe pulmonary nodule, indeterminate. Non-contrast chest CT at 6-12 months is recommended. If the nodule is stable at time of repeat CT, then future CT at 18-24 months (from today's scan) is considered optional for low-risk patients, but is recommended for high-risk patients. This recommendation follows the consensus statement: Guidelines for Management of Incidental Pulmonary Nodules Detected on CT Images: From the Fleischner Society 2017; Radiology 2017; 284:228-243.** 5. Emphysema.   TTE pending   PHYSICAL EXAM  Temp:  [97 F (36.1 C)-98.6 F (37 C)] 98 F (36.7 C) (08/30 1348) Pulse Rate:  [66-87] 74 (08/30 1348) Resp:  [16-20] 20 (08/30 1348) BP: (110-183)/(68-97) 121/68 (08/30 1348) SpO2:  [94 %-99 %] 98 % (08/30 1348) Weight:  [158 lb 11.2 oz (72 kg)] 158 lb 11.2 oz (72 kg) (08/29 2214)  General - Well nourished, well developed, in no apparent distress.  Ophthalmologic - Sharp disc margins OU.   Cardiovascular - Regular rate and rhythm.  Mental Status -  Level of arousal and orientation to time, place, and person were intact. Language including expression, naming, repetition, comprehension was assessed and found intact. Fund of Knowledge was assessed and was intact.  Cranial Nerves II - XII - II - Visual field intact OU. III, IV, VI - Extraocular movements intact. V - Facial sensation intact bilaterally. VII - right nasolabial fold flattening. VIII - Hearing & vestibular intact bilaterally. X - Palate elevates symmetrically. XI - Chin turning & shoulder shrug intact bilaterally. XII - Tongue protrusion intact.  Motor Strength - The patient's strength was normal in all extremities and pronator drift was absent.  Bulk was normal and fasciculations were absent.   Motor Tone - Muscle tone was assessed at the neck and appendages and was normal.  Reflexes -  The patient's reflexes were 1+ in all extremities and he had no pathological reflexes.  Sensory - Light touch, temperature/pinprick, vibration and proprioception were assessed and were symmetrical.    Coordination - The patient had normal movements in the hands and feet with no ataxia or dysmetria.  Tremor was absent.  Gait and Station - deferred   ASSESSMENT/PLAN Mr. Deretha EmoryWilliam Nolting is a 76 y.o. male with history of HTN and smoking and prior alcohol use presenting with right sided face and arm and leg numbness and tingling. He did not receive IV t-PA due to out of window.  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 Yes; Fluid consistency: Thin  No antithrombotic prior to admission, now on aspirin 325 mg daily. Due to intracranial stenosis, add plavix for DAPT for 3 months and then either ASA or plavix alone.    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, asymptomatic  CTA showed right ICA 50% and left ICA 75% stenosis  However, asymptomatic at this time (current stroke at PCA territory)  Needs to quit smoking  Needs to close follow up with [facility] as outpt   Cocaine abuse  UDS postive on cocaine  Cocaine cessation counseling provided  Pt is willing to quit  Hypertension  Stable  Permissive hypertension (OK if < 220/120) but gradually normalize in 5-7 days  Long-term BP goal  normotensive  Hyperlipidemia  Home meds:  none  LDL 131, goal < 70  Add atorvastatin 80  Continue statin at discharge  Tobacco abuse  Current smoker  Smoking cessation counseling provided  Nicotine patch provided  Pt is willing to quit  Other Stroke Risk Factors  Advanced age  Prior history of ETOH abuse, advised to drink no more than 1-2 drink(s) a day  Other Active Problems  Pt interested in [facility] trial  Hospital day # 1  Marvel Plan, MD PhD Stroke Neurology 11/24/2016 6:11 PM    To contact Stroke Continuity provider, please refer to [email or web address]. After hours, contact General Neurology

## 2016-11-24 NOTE — Progress Notes (Signed)
PROGRESS NOTE    Paco Cislo  ZOX:[id number] DOB: [birth year] DOA: 11/23/2016 PCP: System, Pcp Not In   Brief Narrative:  Thaddaeus Granja is an 76 y.o. male with past medical history of hypertension and current smoker with 50 year smoking history, prior history of alcohol abuse came to the emergency room for right-sided face arm and leg numbness and tingling. The patient felt the symptoms on 11/21/2016, however decided not to come to the ER. He presented to the [place] and was told to come to the emergency room for further evaluation. He had an MRI head at [facility] ER showed acute left thalamic infarct. His blood pressures have been elevated to 170s while in the emergency room. He denies any other symptoms such as slurred speech, difficulty getting words out, weakness in arm or leg, blurring of vision or gait imbalance. Patient was admitted for a CVA and Neurology was consulted.   Assessment & Plan:   Active Problems:   Stroke [facility])   Acute Left Lateral Thalamic CVA with resultant numbness of Right Face, Arm and Leg -Admitted to Telemetry; C/w Neurochecks per Protocol -Head CT showed No acute intracranial abnormality. Mild cerebral atrophy and chronic ischemic microvascular white matter disease -MRI of Brain showed Subcentimeter acute infarction within left lateral thalamus. No acute hemorrhage.Moderate chronic microvascular ischemic changes and parenchymal  volume loss of the brain. 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 Short segment atheromatous stenosis of up to 50% at the proximal right ICA. No large vessel occlusion within the intracranial circulation. Moderate multifocal intracranial atheromatous disease as above. Most notable findings include short-segment moderate distal left M1 stenosis with moderate carotid siphon atherosclerotic disease. -ECHOCardiogram showed Systolic  function was normal. The estimated ejection fraction was in the range of 60% to 65%. Wall motion was normal; there were no regional wall motion abnormalities. Doppler parameters are consistent with abnormal left ventricular relaxation (grade 1 diastolic dysfunction). -Neurology Consulted and Appreciated Reccomendations -Stroke Workup Initiated  -Neurology recommending 325 mg po ASA and Plavix 75 mg po daily due to Intracranial Stenosis -Lipid Panel showed Cholesterol of 190, HDL of 40, LDL of 131, TG of 93, and VLDL 19 -C/w Atorvastatin 80 mg po qHS -HbA1c was 5.7 -Allow for Permissive HTN -C/w ASA 325 mg po Daily -PT/OT/SLP Eval -> PT recommending Outpatient PT  Hyperlipidemia -Lipid Panel showed Cholesterol of 190, HDL of 40, LDL of 131, TG of 93, and VLDL 19 -C/w Atorvastatin 80 mg po qHS  Tobacco Abuse -Smoking Cessation Counseling provided  Cocaine Abuse -Positive on UDS -Cocaine abuse Counseling given  Right Rib Cage Pain -Improved  Lung Nodule -Found Incidentally on CTA of Head and Neck -6 mm right lower lobe pulmonary nodule, indeterminate. -Non-contrast chest CT at 6-12 months is recommended  DVT prophylaxis: Enoxaparin Code Status: FULL CODE Family Communication: Discussed with family at bedside Disposition Plan: Home at D/C  Consultants:   Neurology   Procedures:  ECHOCARDIOGRAM Study Conclusions  - Left ventricle: The cavity size was normal. There was mild   concentric hypertrophy. Systolic function was normal. The   estimated ejection fraction was in the range of 60% to 65%. Wall   motion was normal; there were no regional wall motion   abnormalities. Doppler parameters are consistent with abnormal   left ventricular relaxation (grade 1 diastolic dysfunction). - Aortic valve: Transvalvular velocity was within the normal range.  There was no stenosis. There was no regurgitation. - Mitral valve: Transvalvular velocity was within the normal range.    There was no evidence for stenosis. - Right ventricle: The cavity size was normal. Wall thickness was   normal. Systolic function was normal. - Atrial septum: No defect or patent foramen ovale was identified   by color flow Doppler. - Tricuspid valve: There was trivial regurgitation. - Pulmonary arteries: Systolic pressure was within the normal   range. PA peak pressure: 21 mm Hg (S).   Antimicrobials: Anti-infectives    None     Subjective: Seen and examined at bedside and felt improved but still has tingling in arm. No CP or SOB.   Objective: Vitals:   11/24/16 0212 11/24/16 0415 11/24/16 0602 11/24/16 0815  BP: 110/73 119/68 111/75 115/75  Pulse: 87 67 68 75  Resp: 16 16 18 18   Temp:  (!) 97 F (36.1 C) (!) 97.4 F (36.3 C) 98.2 F (36.8 C)  TempSrc:  Oral Oral Oral  SpO2: 94% 94% 98% 96%  Weight:      Height:        Intake/Output Summary (Last 24 hours) at 11/24/16 0818 Last data filed at 11/24/16 0746  Gross per 24 hour  Intake              360 ml  Output              320 ml  Net               40 ml   Filed Weights   11/23/16 2214  Weight: 72 kg (158 lb 11.2 oz)   Examination: Physical Exam:  Constitutional: WN/WD Caucasian male NAD and appears calm and comfortable Eyes: Lids and conjunctivae normal, sclerae anicteric  ENMT: External Ears, Nose appear normal. Grossly normal hearing. Mucous membranes are moist Neck: Appears normal, supple, no cervical masses, normal ROM, no appreciable thyromegaly Respiratory: Clear to auscultation bilaterally, no wheezing, rales, rhonchi or crackles. Normal respiratory effort and patient is not tachypenic. No accessory muscle use.  Cardiovascular: RRR, no murmurs / rubs / gallops. S1 and S2 auscultated. No extremity edema.  Abdomen: Soft, non-tender, non-distended. No masses palpated. No appreciable hepatosplenomegaly. Bowel sounds positive.  GU: Deferred. Musculoskeletal: No clubbing / cyanosis of digits/nails. No joint  deformity upper and lower extremities.  Skin: No rashes, lesions, ulcers. No induration; Warm and dry.  Neurologic: CN 2-12 grossly intact with no focal deficits. Sensation diminished in Right. Romberg sign cerebellar reflexes not assessed.  Psychiatric: Normal judgment and insight. Alert and oriented x 3. Normal mood and appropriate affect.   Data Reviewed: I have personally reviewed following labs and imaging studies  CBC:  Recent Labs Lab 11/23/16 1029 11/23/16 1102 11/24/16 0431  WBC 6.8  --  6.8  NEUTROABS 4.1  --   --   HGB 14.3 15.6 14.1  HCT 44.8 46.0 43.7  MCV 86.3  --  85.4  PLT 202  --  183   Basic Metabolic Panel:  Recent Labs Lab 11/23/16 1029 11/23/16 1102 11/24/16 0431  NA 138 140 141  K 4.6 4.6 4.1  CL 104 101 107  CO2 26  --  28  GLUCOSE 112* 107* 88  BUN 12 15 11   CREATININE 1.18 1.10 1.10  CALCIUM 9.1  --  9.0   GFR: Estimated Creatinine Clearance: 60.9 mL/min (by C-G formula based on SCr of 1.1 mg/dL). Liver Function Tests:  Recent Labs Lab 11/23/16 1029  AST 22  ALT 13*  ALKPHOS 62  BILITOT 0.8  PROT 6.9  ALBUMIN 4.1   No results for input(s): LIPASE, AMYLASE in the last 168 hours. No results for input(s): AMMONIA in the last 168 hours. Coagulation Profile:  Recent Labs Lab 11/23/16 1029  INR 0.91   Cardiac Enzymes: No results for input(s): CKTOTAL, CKMB, CKMBINDEX, TROPONINI in the last 168 hours. BNP (last 3 results) No results for input(s): PROBNP in the last 8760 hours. HbA1C:  Recent Labs  11/24/16 0429  HGBA1C 5.7*   CBG: No results for input(s): GLUCAP in the last 168 hours. Lipid Profile:  Recent Labs  11/24/16 0429  CHOL 190  HDL 40*  LDLCALC 131*  TRIG 93  CHOLHDL 4.8   Thyroid Function Tests: No results for input(s): TSH, T4TOTAL, FREET4, T3FREE, THYROIDAB in the last 72 hours. Anemia Panel: No results for input(s): VITAMINB12, FOLATE, FERRITIN, TIBC, IRON, RETICCTPCT in the last 72 hours. Sepsis  Labs: No results for input(s): PROCALCITON, LATICACIDVEN in the last 168 hours.  No results found for this or any previous visit (from the past 240 hour(s)).   Radiology Studies: Dg Chest 2 View  Result Date: 11/23/2016 CLINICAL DATA:  Productive cough, un known chronicity. EXAM: CHEST  2 VIEW COMPARISON:  None. FINDINGS: Marked hyperinflation. Linear scarring or atelectasis in the bases. No confluent airspace consolidation. Heart size is normal. Hilar and mediastinal contours are unremarkable. Pulmonary vasculature is normal. No pleural effusions. IMPRESSION: Hyperinflation and moderate linear scarring or atelectasis in the bases. No consolidation or effusion. Electronically Signed   By: Ellery Plunk M.D.   On: 11/23/2016 21:41   Ct Head Wo Contrast  Result Date: 11/23/2016 CLINICAL DATA:  Right arm numbness and tingling. EXAM: CT HEAD WITHOUT CONTRAST TECHNIQUE: Contiguous axial images were obtained from the base of the skull through the vertex without intravenous contrast. COMPARISON:  None. FINDINGS: Brain: No evidence of acute infarction, hemorrhage, hydrocephalus, extra-axial collection or mass lesion/mass effect. Bilateral basal ganglia lacunar infarcts. Age-related cerebral atrophy with compensatory dilatation of the ventricles. Periventricular white matter and corona radiata hypodensities favor chronic ischemic microvascular white matter disease. Vascular: Atherosclerotic vascular calcification of the carotid siphons. No hyperdense vessel. Skull:  No fracture or focal lesion. Sinuses/Orbits: The bilateral paranasal sinuses and mastoid air cells are clear. The orbits are unremarkable. Other: None. IMPRESSION: 1.  No acute intracranial abnormality. 2. 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 Electronically Signed   By: Obie Dredge M.D.   On: 11/23/2016 12:51   Mr Brain Wo Contrast  Result Date: 11/23/2016 CLINICAL DATA:  76 y/o M; numbness and  tingling of the right face, arm, and leg. EXAM: MRI HEAD WITHOUT CONTRAST TECHNIQUE: Multiplanar, multiecho pulse sequences of the brain and surrounding structures were obtained without intravenous contrast. COMPARISON:  11/23/2016 CT of the head FINDINGS: Brain: Subcentimeter focus of reduced diffusion within the left lateral thalamus. Chronic lacunar infarct in right mid corona radiata. Moderate chronic microvascular ischemic changes of white matter. Moderate brain parenchymal volume loss. Prominent perivascular spaces within lentiform nuclei bilaterally. Small foci of susceptibility hypointensity are present within the left pons and right anterior temporal lobe compatible with hemosiderin deposition of old microhemorrhage. No extra-axial collection or effacement of basilar cisterns. Vascular: Normal flow voids. Skull and upper cervical spine: Normal marrow signal. Sinuses/Orbits: Negative. Other: None. IMPRESSION: 1. Subcentimeter acute infarction within left lateral thalamus. No acute hemorrhage. 2. Moderate chronic microvascular ischemic changes and parenchymal volume loss of  the brain. These results were called by telephone at the time of interpretation on 11/23/2016 at 7:06 pm to PA [facility], who verbally acknowledged these results. Electronically Signed   By: Mitzi Hansen M.D.   On: 11/23/2016 19:14   Scheduled Meds: .  stroke: mapping our early stages of recovery book   Does not apply Once  . aspirin  325 mg Oral Daily  . atorvastatin  80 mg Oral q1800  . enoxaparin (LOVENOX) injection  40 mg Subcutaneous Q24H   Continuous Infusions:   LOS: 1 day   Merlene Laughter, DO [facility] Pager [phone]  If 7PM-7AM, please contact night-coverage [email or web address] Password [email or web address] 11/24/2016, 8:18 AM

## 2016-11-24 NOTE — Progress Notes (Signed)
Occupational Therapy Treatment Patient Details Name: Jim Payne MRN: [id number] DOB: Mar 19, 1941 Today's Date: 11/24/2016    History of present illness HPI: This 76 year old man reports that on 11-21-2016 he developed numbness involving his right side of his face and face his right fourth and fifth digit, his right lateral arm, and right distal lateral leg numbness. The symptoms have been constant, not aggravated or alleviated by anything. He denies any difficulty speaking, no difficulty moving any of his extremities, no difficulty swallowing. He reports a nonproductive cough over the past week or so, he has a longtime smoker with a 30-40-pack-year history of smoking. He reports never having any TIAs or strokes in the past, he presented to the [place] and according to his report was told that he had a stroke based on a CT scan. ETHOH ABUSE AND IS IN RECOVERY   OT comments  PNT IS AT BASELINE FOR ADLS AND MOBILITY. PNT WAS ABLE TO PERFORM TASKS WITHOUT LOB.Marland Kitchen PNT STATES THAT HE FEELS LIKE HE IS AT BASELINE. PNT. DID NOT HAVE ANY R UE WEAKNESS OR DECREASED COORDINATION. PNT ONLY COMPLAINT IS R HAND NUMBNESS IN LAST 2 DIGITS.   Follow Up Recommendations       Equipment Recommendations       Recommendations for Other Services      Precautions / Restrictions Precautions Precautions: Fall Restrictions Weight Bearing Restrictions: No       Mobility Bed Mobility Overal bed mobility: Independent                Transfers Overall transfer level: Independent                    Balance                                           ADL either performed or assessed with clinical judgement   ADL Overall ADL's : Independent                                             Vision Baseline Vision/History: Wears glasses Wears Glasses: Reading only     Perception     Praxis      Cognition Arousal/Alertness: Awake/alert Behavior During Therapy:  WFL for tasks assessed/performed Overall Cognitive Status: Within Functional Limits for tasks assessed                                          Exercises     Shoulder Instructions       General Comments      Pertinent Vitals/ Pain       Pain Assessment: No/denies pain  Home Living Family/patient expects to be discharged to:: Private residence Living Arrangements: Non-relatives/Friends Available Help at Discharge: Available 24 hours/day Type of Home: House Home Access: Stairs to enter [employer] of Steps:  (2)   Home Layout: Two level;Full bath on main level;Able to live on main level with bedroom/bathroom     Bathroom Shower/Tub: Producer, television/film/video: Standard     Home Equipment: Grab bars - toilet;Grab bars - tub/shower          Prior Functioning/Environment Level  of Independence: Independent            Frequency           Progress Toward Goals  OT Goals(current goals can now be found in the care plan section)     Acute Rehab OT Goals Patient Stated Goal: GO HOME  Plan      Co-evaluation                 AM-PAC PT "6 Clicks" Daily Activity     Outcome Measure   Help from another person eating meals?: None Help from another person taking care of personal grooming?: None Help from another person toileting, which includes using toliet, bedpan, or urinal?: None Help from another person bathing (including washing, rinsing, drying)?: None Help from another person to put on and taking off regular upper body clothing?: None Help from another person to put on and taking off regular lower body clothing?: None 6 Click Score: 24    End of Session        Activity Tolerance Patient tolerated treatment well   Patient Left in bed;with bed alarm set   Nurse Communication          Time: [id number] OT Time Calculation (min): 25 min  Charges: OT Evaluation $OT Eval Low Complexity: 1 Low OT  Treatments $Self Care/Home Management : 8-22 mins  6 CLICKS 24  Niyati Heinke 11/24/2016, 9:40 AM

## 2016-11-25 ENCOUNTER — Encounter (HOSPITAL_COMMUNITY): Payer: Self-pay | Admitting: *Deleted

## 2016-11-25 DIAGNOSIS — R911 Solitary pulmonary nodule: Secondary | ICD-10-CM

## 2016-11-25 DIAGNOSIS — I638 Other cerebral infarction: Principal | ICD-10-CM

## 2016-11-25 LAB — COMPREHENSIVE METABOLIC PANEL
ALBUMIN: 4 g/dL (ref 3.5–5.0)
ALT: 11 U/L — ABNORMAL LOW (ref 17–63)
ANION GAP: 8 (ref 5–15)
AST: 18 U/L (ref 15–41)
Alkaline Phosphatase: 58 U/L (ref 38–126)
BUN: 15 mg/dL (ref 6–20)
CHLORIDE: 107 mmol/L (ref 101–111)
CO2: 25 mmol/L (ref 22–32)
Calcium: 9.1 mg/dL (ref 8.9–10.3)
Creatinine, Ser: 1.15 mg/dL (ref 0.61–1.24)
GFR calc non Af Amer: >60 mL/min (ref >60)
GLUCOSE: 92 mg/dL (ref 65–99)
Potassium: 4.1 mmol/L (ref 3.5–5.1)
Sodium: 140 mmol/L (ref 135–145)
Total Bilirubin: 1 mg/dL (ref 0.3–1.2)
Total Protein: 6.8 g/dL (ref 6.5–8.1)

## 2016-11-25 LAB — CBC WITH DIFFERENTIAL/PLATELET
BASOS PCT: 0 %
Basophils Absolute: 0 10*3/uL (ref 0.0–0.1)
Eosinophils Absolute: 0.1 10*3/uL (ref 0.0–0.7)
Eosinophils Relative: 1 %
HEMATOCRIT: 45.7 % (ref 39.0–52.0)
HEMOGLOBIN: 14.8 g/dL (ref 13.0–17.0)
LYMPHS ABS: 2.4 10*3/uL (ref 0.7–4.0)
LYMPHS PCT: 25 %
MCH: 27.6 pg (ref 26.0–34.0)
MCHC: 32.4 g/dL (ref 30.0–36.0)
MCV: 85.3 fL (ref 78.0–100.0)
MONO ABS: 0.5 10*3/uL (ref 0.1–1.0)
Monocytes Relative: 5 %
NEUTROS ABS: 6.4 10*3/uL (ref 1.7–7.7)
NEUTROS PCT: 69 %
Platelets: 189 10*3/uL (ref 150–400)
RBC: 5.36 MIL/uL (ref 4.22–5.81)
RDW: 14.1 % (ref 11.5–15.5)
WBC: 9.4 10*3/uL (ref 4.0–10.5)

## 2016-11-25 LAB — PHOSPHORUS: Phosphorus: 3.1 mg/dL (ref 2.5–4.6)

## 2016-11-25 LAB — MAGNESIUM: Magnesium: 1.9 mg/dL (ref 1.7–2.4)

## 2016-11-25 MED ORDER — CLOPIDOGREL BISULFATE 75 MG PO TABS: 75.0000 mg | ORAL_TABLET | Freq: Every day | ORAL | 0 refills | Status: DC

## 2016-11-25 MED ORDER — ASPIRIN 325 MG PO TABS: 325.0000 mg | ORAL_TABLET | Freq: Every day | ORAL | 0 refills | Status: DC

## 2016-11-25 MED ORDER — ATORVASTATIN CALCIUM 80 MG PO TABS: 80.0000 mg | ORAL_TABLET | Freq: Every day | ORAL | 0 refills | Status: AC

## 2016-11-25 MED ORDER — STROKE: EARLY STAGES OF RECOVERY BOOK: 1.0000 | Freq: Once | 0 refills | Status: AC

## 2016-11-25 MED ORDER — SENNOSIDES-DOCUSATE SODIUM 8.6-50 MG PO TABS: 1.0000 | ORAL_TABLET | Freq: Every evening | ORAL | 0 refills | Status: DC | PRN

## 2016-11-25 NOTE — Progress Notes (Signed)
Physical Therapy Treatment Patient Details Name: Jim Payne MRN: [id number] DOB: 07/29/1940 Today's Date: 11/25/2016    History of Present Illness Pt is a 76 y/o male admitted secondary to numbness involving his right side of his face and his right fourth and fifth digit, his right lateral arm, and right distal lateral leg numbness. 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 Pt would continue to benefit from skilled physical therapy services at this time while admitted and after d/c to address the below listed limitations in order to improve overall safety and independence with functional mobility.   Follow Up Recommendations  Outpatient PT     Equipment Recommendations  None recommended by PT    Recommendations for Other Services       Precautions / Restrictions Precautions Precautions: Fall Restrictions Weight Bearing Restrictions: No    Mobility  Bed Mobility Overal bed mobility: Independent                Transfers Overall transfer level: Independent                  Ambulation/Gait Ambulation/Gait assistance: Supervision Ambulation Distance (Feet): 300 Feet Assistive device: None Gait Pattern/deviations: Step-through pattern;Decreased stride length Gait velocity: WFL Gait velocity interpretation: at or above normal speed for age/gender General Gait Details: mild instability (improved from yesterday; pt adamant about ambulating in his flip flops) but no overt LOB or need for physical assistance   Stairs            Wheelchair Mobility    Modified Rankin (Stroke Patients Only) Modified Rankin (Stroke Patients Only) Pre-Morbid Rankin Score: No symptoms Modified Rankin: Slight disability     Balance Overall balance  assessment: Needs assistance Sitting-balance support: Feet supported Sitting balance-Leahy Scale: Good     Standing balance support: No upper extremity supported;During functional activity Standing balance-Leahy Scale: Fair                              Cognition Arousal/Alertness: Awake/alert Behavior During Therapy: WFL for tasks assessed/performed Overall Cognitive Status: Within Functional Limits for tasks assessed                                        Exercises      General Comments        Pertinent Vitals/Pain Pain Assessment: No/denies pain    Home Living                      Prior Function            PT Goals (current goals can now be found in the care plan section) Acute Rehab PT Goals PT Goal Formulation: With patient Time For Goal Achievement: 12/08/16 Potential to Achieve Goals: Good Progress towards PT goals: Progressing toward goals    Frequency    Min 4X/week      PT Plan Current plan remains appropriate    Co-evaluation              AM-PAC PT "6 Clicks" Daily Activity  Outcome Measure  Difficulty turning over in bed (including adjusting bedclothes, sheets and  blankets)?: None Difficulty moving from lying on back to sitting on the side of the bed? : None Difficulty sitting down on and standing up from a chair with arms (e.g., wheelchair, bedside commode, etc,.)?: None Help needed moving to and from a bed to chair (including a wheelchair)?: None Help needed walking in hospital room?: None Help needed climbing 3-5 steps with a railing? : A Little 6 Click Score: 23    End of Session   Activity Tolerance: Patient tolerated treatment well Patient left: in bed;with call bell/phone within reach;with bed alarm set Nurse Communication: Mobility status PT Visit Diagnosis: Unsteadiness on feet (R26.81);Other abnormalities of gait and mobility (R26.89)     Time: 1437-1450 PT Time Calculation (min)  (ACUTE ONLY): 13 min  Charges:  $Gait Training: 8-22 mins                    G Codes:       [place] ValleyJennifer Mada Sadik, [place]PT, [place] [phone]    Alessandra BevelsJennifer M Azzie Thiem 11/25/2016, 3:15 PM

## 2016-11-25 NOTE — Progress Notes (Signed)
Pt discharged from hospital per orders from MD. Pt educated on discharge instructions. Pt verbalized understanding of instructions. All questions and concerns were addressed. Pt's IV's were removed prior to discharge. Pt exited hospital via wheelchair.

## 2016-11-25 NOTE — Discharge Summary (Signed)
Physician Discharge Summary  Jim Payne ZOX:[id number] DOB: 03/03/1941 DOA: 11/23/2016  PCP: System, Pcp Not In  Admit date: 11/23/2016 Discharge date: 11/25/2016  Admitted From: Home Disposition:  Home with outpatient PT  Recommendations for Outpatient Follow-up:  1. Follow up with PCP in 1-2 weeks 2. Follow up with Neurology in 6 weeks 3. Follow up with [facility] as an outpatient for evaluation of ICA stenosis 4. Avoid illicit substances 5. Please obtain CMP/CBC, Mag, Phos in one week 6. Follow up on Lung Nodule found incidentally with Non-Contrast Chest CT in 6-12 months  Home Health: No, outpatient PT Equipment/Devices: None    Discharge Condition: Stable  CODE STATUS: FULL CODE Diet recommendation: Heart healthy Diet  Brief/Interim Summary: Jim RotaWilliam Rawlsis an 76 y.o.malewith past medical history of hypertension and current smoker with 50 year smoking history, prior history of alcohol abuse came to the emergency room for right-sided face arm and leg numbness and tingling. The patient felt the symptoms on 11/21/2016, however decided not to come to the ER. He presented to the [facility] and was told to come to the emergency room for further evaluation. He [facility] showed acute left thalamic infarct. His blood pressures have been elevated to 170s while in the emergency room. He denies any other symptoms such as slurred speech, difficulty getting words out, weakness in arm or leg, blurring of vision or gait imbalance. Patient was admitted for a CVA and Neurology was consulted. He underwent stroke work up and was placed on Dual Antiplatelet Therapy. He was deemed medically stable to be D/C'd home and will need to follow up with PCP and with Neurology as an outpatient.   Discharge Diagnoses:  Active Problems:   Stroke [facility](HCC)  Acute Left Lateral Thalamic CVA with resultant numbness of Right Face, Arm and Leg -Admitted to Telemetry; C/w Neurochecks  per Protocol -Head CT showed No acute intracranial abnormality. Mild cerebral atrophy and chronic ischemic microvascular white matter disease -MRI of Brain showed Subcentimeter acute infarction within left lateral thalamus. No acute hemorrhage.Moderate chronic microvascular ischemic changes and parenchymal  volume loss of the brain. 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 Short segment atheromatous stenosis of up to 50% at the proximal right ICA. No large vessel occlusion within the intracranial circulation. Moderate multifocal intracranial atheromatous disease as above. Most notable findings include short-segment moderate distal left M1 stenosis with moderate carotid siphon atherosclerotic disease. -ECHOCardiogram showed Systolic function was normal. The estimated ejection fraction was in the range of 60% to 65%. Wall motion was normal; there were no regional wall motion abnormalities. Doppler parameters are consistent with abnormal left ventricular relaxation (grade 1 diastolic dysfunction). 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 Short segment atheromatous stenosis of up to 50% at the proximal right ICA. No large vessel occlusion  within the intracranial circulation. Moderate multifocal intracranial atheromatous disease as above. Most notable  findings include short-segment moderate distal left M1 stenosis with moderate carotid siphon atherosclerotic disease. -Advised VVS Consultation as an outpatient -Advised to quit smoking but patient did not want Nicotine Patch  Hyperlipidemia -Lipid Panel showed Cholesterol of 190, HDL of 40, LDL of 131, TG of 93, and VLDL 19 -C/w Atorvastatin 80 mg po qHS  Tobacco Abuse -Smoking Cessation Counseling provided -Patient declined Nicotine Patch  Cocaine Abuse -Positive on UDS -Cocaine abuse Counseling given  Right Rib Cage Pain -Improved  Lung Nodule -Found Incidentally on CTA of Head and Neck -6 mm right lower lobe pulmonary nodule, indeterminate. -Non-contrast chest CT at 6-12 months is recommended  Discharge Instructions  Discharge Instructions    Ambulatory referral to Neurology    Complete by:  As directed    An appointment is requested in approximately: 6 weeks   Ambulatory referral to Physical Therapy    Complete by:  As directed    Call MD for:  difficulty breathing, headache or visual disturbances    Complete by:  As directed    Call MD for:  extreme fatigue    Complete by:  As directed    Call MD for:  hives    Complete by:  As directed    Call MD for:  persistant dizziness or light-headedness    Complete by:  As directed    Call MD for:  persistant nausea and vomiting    Complete by:  As directed    Call MD for:  redness, tenderness, or signs of infection (pain, swelling, redness, odor or green/yellow discharge around incision site)    Complete by:  As directed    Call MD for:  severe uncontrolled pain    Complete by:  As directed    Call MD for:  temperature >100.4    Complete by:  As directed    Diet - low sodium heart healthy    Complete by:  As directed    Discharge instructions    Complete by:  As directed    Follow up with PCP and Neurology as an outpatient. Take all medications as prescribed. If symptoms change or worsen please return to the ED for  evaluation. Avoid illicit Drug use and stop smoking.   Increase activity slowly    Complete by:  As directed      Allergies as of 11/25/2016   No Known Allergies     Medication List    STOP taking these medications   amoxicillin 500 MG capsule Commonly known as:  AMOXIL   naproxen sodium 220 MG tablet Commonly known as:  ANAPROX     TAKE these medications    stroke: mapping our early stages of recovery book Misc 1 each by Does not apply route once.   aspirin 325 MG tablet Take 1 tablet (325 mg total) by mouth daily.   atorvastatin 80 MG tablet Commonly known as:  LIPITOR Take 1 tablet (80 mg total) by mouth daily at 6 PM.   clopidogrel 75 MG tablet Commonly known as:  PLAVIX Take 1 tablet (75 mg total) by mouth daily.   senna-docusate 8.6-50 MG tablet Commonly known as:  Senokot-S Take 1 tablet by mouth at bedtime as needed for mild constipation.            Discharge Care Instructions        Start     Ordered   11/26/16 0000  aspirin 325 MG tablet  Daily     11/25/16 1421   11/26/16 0000  clopidogrel (PLAVIX) 75 MG tablet  Daily     11/25/16 1421   11/25/16 0000  Ambulatory referral to Physical Therapy     11/25/16 1231   11/25/16 0000   stroke: mapping our early stages of recovery book MISC   Once     11/25/16 1421   11/25/16 0000  atorvastatin (LIPITOR) 80 MG tablet  Daily-1800     11/25/16 1421   11/25/16 0000  senna-docusate (SENOKOT-S) 8.6-50 MG tablet  At bedtime PRN     11/25/16 1421   11/25/16 0000  Ambulatory referral to Neurology    Comments:  An appointment is requested in approximately: 6 weeks   11/25/16 1421   11/25/16 0000  Increase activity slowly     11/25/16 1421   11/25/16 0000  Diet - low sodium heart healthy     11/25/16 1421   11/25/16 0000  Discharge instructions    Comments:  Follow up with PCP and Neurology as an outpatient. Take all medications as prescribed. If symptoms change or worsen please return to the ED for  evaluation. Avoid illicit Drug use and stop smoking.   11/25/16 1421   11/25/16 0000  Call MD for:  temperature >100.4     11/25/16 1421   11/25/16 0000  Call MD for:  persistant nausea and vomiting     11/25/16 1421   11/25/16 0000  Call MD for:  severe uncontrolled pain     11/25/16 1421   11/25/16 0000  Call MD for:  redness, tenderness, or signs of infection (pain, swelling, redness, odor or green/yellow discharge around incision site)     11/25/16 1421   11/25/16 0000  Call MD for:  difficulty breathing, headache or visual disturbances     11/25/16 1421   11/25/16 0000  Call MD for:  persistant dizziness or light-headedness     11/25/16 1421   11/25/16 0000  Call MD for:  extreme fatigue     11/25/16 1421   11/25/16 0000  Call MD for:  hives     11/25/16 1421      No Known Allergies  Consultations:  Neurology  Procedures/Studies: Ct Angio Head W Or Wo Contrast  Result Date: 11/24/2016 CLINICAL DATA:  Follow-up examination for acute stroke. EXAM: CT ANGIOGRAPHY HEAD AND NECK TECHNIQUE: Multidetector CT imaging of the head and neck was performed using the standard protocol during bolus administration of intravenous contrast. Multiplanar CT image reconstructions and MIPs were obtained to evaluate the vascular anatomy. Carotid stenosis measurements (when applicable) are obtained utilizing NASCET criteria, using the distal internal carotid diameter as the denominator. CONTRAST:  50 cc of Isovue 370. COMPARISON:  Prior CT and MRI from 11/23/2016. FINDINGS: CT HEAD FINDINGS Brain: Atrophy with moderate chronic microvascular ischemic disease noted, stable. Scatter remote lacunar infarcts within the bilateral basal ganglia. Small evolving acute ischemic infarct within the lateral left thalamus noted, grossly stable from previous MRI. No evidence for associated hemorrhage. No other acute large vessel territory infarct. No acute intracranial hemorrhage. No mass lesion, midline shift or mass  effect. No hydrocephalus. No extra-axial fluid collection. Vascular: No hyperdense vessel. Scattered vascular calcifications noted within the carotid siphons. Skull: Scalp soft tissues and calvarium within normal limits. Sinuses: Paranasal sinuses and mastoid air cells are clear. Orbits: Globes normal soft tissues within normal limits. Review of the MIP images confirms the above findings CTA NECK FINDINGS  Aortic arch: Visualized aortic arch of normal caliber with normal branch pattern. Scattered atheromatous plaque within the arch itself and about the origin of the great vessels without flow-limiting stenosis. Approximate 50% stenosis noted at the origin of the right subclavian artery (series 11, image 324). Additional atheromatous irregularity throughout the remainder the visualized subclavian artery is without flow-limiting stenosis. Right carotid system: Right common carotid artery patent from its origin to the bifurcation without flow-limiting stenosis. Scattered calcified atheromatous plaque about the right bifurcation with associated stenosis of up to approximately 50% by NASCET criteria. Right ICA patent distally to the skullbase without additional stenosis, dissection, or occlusion. Moderate to severe narrowing noted at the origin of the right external carotid artery. Left carotid system: Left common carotid artery patent from its origin to the bifurcation without flow-limiting stenosis. Scattered calcified noncalcified plaque about the left bifurcation/ proximal left ICA. Associated stenosis of up to 75% by NASCET criteria (series 11, image 243). There is a superimposed penetrating atheromatous plaque extending from the posterior aspect of the true lumen at the proximal left ICA (series 11, image 243). Area of involvement extends from the bifurcation and measures approximately 21 mm in length. Left ICA patent distally to the skullbase without stenosis, dissection, or occlusion. Moderate narrowing noted at  the origin of the left external carotid artery. Vertebral arteries: Both of the vertebral artery is arise from the subclavian arteries. Focal plaque at the origin of the dominant right vertebral artery with mild narrowing. Moderate to severe narrowing at the origin of the hypoplastic left vertebral artery. Vertebral arteries otherwise widely patent within the neck without stenosis, dissection, or occlusion. Mild irregularity through the lumen of the distal right V the 2 segment favored to be artifactual (Series 11, image 188). Skeleton: No acute osseus abnormality. No worrisome lytic or blastic osseous lesions. Scattered degenerative spondylolysis noted within cervical spine, most notable at C5-6 and C6-7. Other neck: No acute soft tissue abnormality within the neck. Salivary glands within normal limits. Thyroid normal. No adenopathy. Upper chest: Visualized upper chest demonstrates no acute abnormality. Emphysematous changes noted within the visualized lungs. 6 mm irregular nodular density noted within the superior segment of the right lower lobe (series 9, image 198). Review of the MIP images confirms the above findings CTA HEAD FINDINGS Anterior circulation: Scattered calcified plaque within the petrous, cavernous, and supraclinoid segments of the internal carotid arteries bilaterally. Associated multifocal narrowing of up to approximately 50% at the cavernous ICAs bilaterally, slightly greater on the right. ICA termini widely patent. Dominant left A1 segment widely patent. Hypoplastic right A1 segment patent as well. Anterior communicating artery normal. Anterior cerebral arteries patent to their distal aspects. Multifocal atheromatous irregularity within the left M1 segment with moderate short-segment distal moderate stenosis of approximately 50% (series 11, image 113). Left MCA bifurcation within normal limits. No proximal left M2 occlusion. Distal left MCA branches demonstrate multifocal atheromatous  irregularity but are widely patent to their distal aspects. Right M1 segment demonstrates mild atheromatous irregularity without flow-limiting stenosis. Right MCA bifurcation within normal limits. No proximal right M2 occlusion. Distal right MCA branches demonstrate small vessel irregularity but are widely patent to their distal aspects. Posterior circulation: Multifocal non stenotic calcified plaque within the dominant right V4 segment. Hypoplastic left V4 segment, largely terminates at the left PICA, although a tiny branch ascending towards the vertebrobasilar junction. Posterior inferior cerebral arteries patent bilaterally. Basilar artery widely patent to its distal aspect without flow-limiting stenosis. Superior cerebral arteries patent bilaterally. Left PCA  supplied via the basilar the and is widely patent to its distal aspect. Fetal type origin of the right PCA supplied via a widely patent right posterior communicating artery. Right PCA also patent to its distal aspect. Venous sinuses: Patent. Anatomic variants: Fetal type origin of the right PCA. No aneurysm or vascular malformation. Small focal outpouching arising at the expected location of the takeoff of the left posterior communicating artery favored to reflect a small vascular infundibulum. Delayed phase: No pathologic enhancement. Review of the MIP images confirms the above findings IMPRESSION: 1. 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 2. Short segment atheromatous stenosis of up to 50% at the proximal right ICA. 3. No large vessel occlusion within the intracranial circulation. Moderate multifocal intracranial atheromatous disease as above. Most notable findings include short-segment moderate distal left M1 stenosis with moderate carotid siphon atherosclerotic disease. 4. **An incidental finding of potential clinical significance has been found. 6 mm right lower lobe pulmonary nodule,  indeterminate. Non-contrast chest CT at 6-12 months is recommended. If the nodule is stable at time of repeat CT, then future CT at 18-24 months (from today's scan) is considered optional for low-risk patients, but is recommended for high-risk patients. This recommendation follows the consensus statement: Guidelines for Management of Incidental Pulmonary Nodules Detected on CT Images: From the Fleischner Society 2017; Radiology 2017; 284:228-243.** 5. Emphysema. Electronically Signed   By: Rise Mu M.D.   On: 11/24/2016 15:19   Dg Chest 2 View  Result Date: 11/23/2016 CLINICAL DATA:  Productive cough, un known chronicity. EXAM: CHEST  2 VIEW COMPARISON:  None. FINDINGS: Marked hyperinflation. Linear scarring or atelectasis in the bases. No confluent airspace consolidation. Heart size is normal. Hilar and mediastinal contours are unremarkable. Pulmonary vasculature is normal. No pleural effusions. IMPRESSION: Hyperinflation and moderate linear scarring or atelectasis in the bases. No consolidation or effusion. Electronically Signed   By: Ellery Plunk M.D.   On: 11/23/2016 21:41   Ct Head Wo Contrast  Result Date: 11/23/2016 CLINICAL DATA:  Right arm numbness and tingling. EXAM: CT HEAD WITHOUT CONTRAST TECHNIQUE: Contiguous axial images were obtained from the base of the skull through the vertex without intravenous contrast. COMPARISON:  None. FINDINGS: Brain: No evidence of acute infarction, hemorrhage, hydrocephalus, extra-axial collection or mass lesion/mass effect. Bilateral basal ganglia lacunar infarcts. Age-related cerebral atrophy with compensatory dilatation of the ventricles. Periventricular white matter and corona radiata hypodensities favor chronic ischemic microvascular white matter disease. Vascular: Atherosclerotic vascular calcification of the carotid siphons. No hyperdense vessel. Skull:  No fracture or focal lesion. Sinuses/Orbits: The bilateral paranasal sinuses and mastoid  air cells are clear. The orbits are unremarkable. Other: None. IMPRESSION: 1.  No acute intracranial abnormality. 2. 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 Electronically Signed   By: Obie Dredge M.D.   On: 11/23/2016 12:51   Ct Angio Neck W Or Wo Contrast  Result Date: 11/24/2016 CLINICAL DATA:  Follow-up examination for acute stroke. EXAM: CT ANGIOGRAPHY HEAD AND NECK TECHNIQUE: Multidetector CT imaging of the head and neck was performed using the standard protocol during bolus administration of intravenous contrast. Multiplanar CT image reconstructions and MIPs were obtained to evaluate the vascular anatomy. Carotid stenosis measurements (when applicable) are obtained utilizing NASCET criteria, using the distal internal carotid diameter as the denominator. CONTRAST:  50 cc of Isovue 370. COMPARISON:  Prior CT and MRI from 11/23/2016. FINDINGS: CT HEAD FINDINGS Brain: Atrophy with  moderate chronic microvascular ischemic disease noted, stable. Scatter remote lacunar infarcts within the bilateral basal ganglia. Small evolving acute ischemic infarct within the lateral left thalamus noted, grossly stable from previous MRI. No evidence for associated hemorrhage. No other acute large vessel territory infarct. No acute intracranial hemorrhage. No mass lesion, midline shift or mass effect. No hydrocephalus. No extra-axial fluid collection. Vascular: No hyperdense vessel. Scattered vascular calcifications noted within the carotid siphons. Skull: Scalp soft tissues and calvarium within normal limits. Sinuses: Paranasal sinuses and mastoid air cells are clear. Orbits: Globes normal soft tissues within normal limits. Review of the MIP images confirms the above findings CTA NECK FINDINGS Aortic arch: Visualized aortic arch of normal caliber with normal branch pattern. Scattered atheromatous plaque within the arch itself and about the origin of the great vessels without  flow-limiting stenosis. Approximate 50% stenosis noted at the origin of the right subclavian artery (series 11, image 324). Additional atheromatous irregularity throughout the remainder the visualized subclavian artery is without flow-limiting stenosis. Right carotid system: Right common carotid artery patent from its origin to the bifurcation without flow-limiting stenosis. Scattered calcified atheromatous plaque about the right bifurcation with associated stenosis of up to approximately 50% by NASCET criteria. Right ICA patent distally to the skullbase without additional stenosis, dissection, or occlusion. Moderate to severe narrowing noted at the origin of the right external carotid artery. Left carotid system: Left common carotid artery patent from its origin to the bifurcation without flow-limiting stenosis. Scattered calcified noncalcified plaque about the left bifurcation/ proximal left ICA. Associated stenosis of up to 75% by NASCET criteria (series 11, image 243). There is a superimposed penetrating atheromatous plaque extending from the posterior aspect of the true lumen at the proximal left ICA (series 11, image 243). Area of involvement extends from the bifurcation and measures approximately 21 mm in length. Left ICA patent distally to the skullbase without stenosis, dissection, or occlusion. Moderate narrowing noted at the origin of the left external carotid artery. Vertebral arteries: Both of the vertebral artery is arise from the subclavian arteries. Focal plaque at the origin of the dominant right vertebral artery with mild narrowing. Moderate to severe narrowing at the origin of the hypoplastic left vertebral artery. Vertebral arteries otherwise widely patent within the neck without stenosis, dissection, or occlusion. Mild irregularity through the lumen of the distal right V the 2 segment favored to be artifactual (Series 11, image 188). Skeleton: No acute osseus abnormality. No worrisome lytic or  blastic osseous lesions. Scattered degenerative spondylolysis noted within cervical spine, most notable at C5-6 and C6-7. Other neck: No acute soft tissue abnormality within the neck. Salivary glands within normal limits. Thyroid normal. No adenopathy. Upper chest: Visualized upper chest demonstrates no acute abnormality. Emphysematous changes noted within the visualized lungs. 6 mm irregular nodular density noted within the superior segment of the right lower lobe (series 9, image 198). Review of the MIP images confirms the above findings CTA HEAD FINDINGS Anterior circulation: Scattered calcified plaque within the petrous, cavernous, and supraclinoid segments of the internal carotid arteries bilaterally. Associated multifocal narrowing of up to approximately 50% at the cavernous ICAs bilaterally, slightly greater on the right. ICA termini widely patent. Dominant left A1 segment widely patent. Hypoplastic right A1 segment patent as well. Anterior communicating artery normal. Anterior cerebral arteries patent to their distal aspects. Multifocal atheromatous irregularity within the left M1 segment with moderate short-segment distal moderate stenosis of approximately 50% (series 11, image 113). Left MCA bifurcation within normal limits. No proximal  left M2 occlusion. Distal left MCA branches demonstrate multifocal atheromatous irregularity but are widely patent to their distal aspects. Right M1 segment demonstrates mild atheromatous irregularity without flow-limiting stenosis. Right MCA bifurcation within normal limits. No proximal right M2 occlusion. Distal right MCA branches demonstrate small vessel irregularity but are widely patent to their distal aspects. Posterior circulation: Multifocal non stenotic calcified plaque within the dominant right V4 segment. Hypoplastic left V4 segment, largely terminates at the left PICA, although a tiny branch ascending towards the vertebrobasilar junction. Posterior inferior  cerebral arteries patent bilaterally. Basilar artery widely patent to its distal aspect without flow-limiting stenosis. Superior cerebral arteries patent bilaterally. Left PCA supplied via the basilar the and is widely patent to its distal aspect. Fetal type origin of the right PCA supplied via a widely patent right posterior communicating artery. Right PCA also patent to its distal aspect. Venous sinuses: Patent. Anatomic variants: Fetal type origin of the right PCA. No aneurysm or vascular malformation. Small focal outpouching arising at the expected location of the takeoff of the left posterior communicating artery favored to reflect a small vascular infundibulum. Delayed phase: No pathologic enhancement. Review of the MIP images confirms the above findings IMPRESSION: 1. 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 2. Short segment atheromatous stenosis of up to 50% at the proximal right ICA. 3. No large vessel occlusion within the intracranial circulation. Moderate multifocal intracranial atheromatous disease as above. Most notable findings include short-segment moderate distal left M1 stenosis with moderate carotid siphon atherosclerotic disease. 4. **An incidental finding of potential clinical significance has been found. 6 mm right lower lobe pulmonary nodule, indeterminate. Non-contrast chest CT at 6-12 months is recommended. If the nodule is stable at time of repeat CT, then future CT at 18-24 months (from today's scan) is considered optional for low-risk patients, but is recommended for high-risk patients. This recommendation follows the consensus statement: Guidelines for Management of Incidental Pulmonary Nodules Detected on CT Images: From the Fleischner Society 2017; Radiology 2017; 284:228-243.** 5. Emphysema. Electronically Signed   By: Rise Mu M.D.   On: 11/24/2016 15:19   Mr Brain Wo Contrast  Result Date:  11/23/2016 CLINICAL DATA:  76 y/o M; numbness and tingling of the right face, arm, and leg. EXAM: MRI HEAD WITHOUT CONTRAST TECHNIQUE: Multiplanar, multiecho pulse sequences of the brain and surrounding structures were obtained without intravenous contrast. COMPARISON:  11/23/2016 CT of the head FINDINGS: Brain: Subcentimeter focus of reduced diffusion within the left lateral thalamus. Chronic lacunar infarct in right mid corona radiata. Moderate chronic microvascular ischemic changes of white matter. Moderate brain parenchymal volume loss. Prominent perivascular spaces within lentiform nuclei bilaterally. Small foci of susceptibility hypointensity are present within the left pons and right anterior temporal lobe compatible with hemosiderin deposition of old microhemorrhage. No extra-axial collection or effacement of basilar cisterns. Vascular: Normal flow voids. Skull and upper cervical spine: Normal marrow signal. Sinuses/Orbits: Negative. Other: None. IMPRESSION: 1. Subcentimeter acute infarction within left lateral thalamus. No acute hemorrhage. 2. Moderate chronic microvascular ischemic changes and parenchymal volume loss of the brain. These results were called by telephone at the time of interpretation on 11/23/2016 at 7:06 pm to PA [facility], who verbally acknowledged these results. Electronically Signed   By: Mitzi Hansen M.D.   On: 11/23/2016 19:14    ECHOCARDIOGRAM Study Conclusions  - Left ventricle: The cavity size was normal. There was mild   concentric hypertrophy. Systolic function was normal.  The   estimated ejection fraction was in the range of 60% to 65%. Wall   motion was normal; there were no regional wall motion   abnormalities. Doppler parameters are consistent with abnormal   left ventricular relaxation (grade 1 diastolic dysfunction). - Aortic valve: Transvalvular velocity was within the normal range.   There was no stenosis. There was no regurgitation. - Mitral valve:  Transvalvular velocity was within the normal range.   There was no evidence for stenosis. - Right ventricle: The cavity size was normal. Wall thickness was   normal. Systolic function was normal. - Atrial septum: No defect or patent foramen ovale was identified   by color flow Doppler. - Tricuspid valve: There was trivial regurgitation. - Pulmonary arteries: Systolic pressure was within the normal   range. PA peak pressure: 21 mm Hg (S).  Subjective: Seen and examined and was doing well. Still had numbness. No nausea or vomiting. No other concerns or complaints at this time.  Discharge Exam: Vitals:   11/25/16 1048 11/25/16 1452  BP: (!) 132/105 114/61  Pulse: 87 73  Resp: 20 20  Temp: 98.4 F (36.9 C) 97.9 F (36.6 C)  SpO2: 98% 98%   Vitals:   11/25/16 0016 11/25/16 0515 11/25/16 1048 11/25/16 1452  BP: 110/66 98/72 (!) 132/105 114/61  Pulse: 73 84 87 73  Resp: 20 20 20 20   Temp: 98.3 F (36.8 C) 98.7 F (37.1 C) 98.4 F (36.9 C) 97.9 F (36.6 C)  TempSrc: Oral Oral Oral Oral  SpO2: 96% 98% 98% 98%  Weight:      Height:       General: Pt is alert, awake, not in acute distress Cardiovascular: RRR, S1/S2 +, no rubs, no gallops Respiratory: CTA bilaterally, no wheezing, no rhonchi Abdominal: Soft, NT, ND, bowel sounds + Extremities: no edema, no cyanosis; Had some Right hand and leg numbness  The results of significant diagnostics from this hospitalization (including imaging, microbiology, ancillary and laboratory) are listed below for reference.    Microbiology: No results found for this or any previous visit (from the past 240 hour(s)).   Labs: BNP (last 3 results) No results for input(s): BNP in the last 8760 hours. Basic Metabolic Panel:  Recent Labs Lab 11/23/16 1029 11/23/16 1102 11/24/16 0431 11/25/16 0537  NA 138 140 141 140  K 4.6 4.6 4.1 4.1  CL 104 101 107 107  CO2 26  --  28 25  GLUCOSE 112* 107* 88 92  BUN 12 15 11 15   CREATININE 1.18 1.10  1.10 1.15  CALCIUM 9.1  --  9.0 9.1  MG  --   --   --  1.9  PHOS  --   --   --  3.1   Liver Function Tests:  Recent Labs Lab 11/23/16 1029 11/25/16 0537  AST 22 18  ALT 13* 11*  ALKPHOS 62 58  BILITOT 0.8 1.0  PROT 6.9 6.8  ALBUMIN 4.1 4.0   No results for input(s): LIPASE, AMYLASE in the last 168 hours. No results for input(s): AMMONIA in the last 168 hours. CBC:  Recent Labs Lab 11/23/16 1029 11/23/16 1102 11/24/16 0431 11/25/16 0537  WBC 6.8  --  6.8 9.4  NEUTROABS 4.1  --   --  6.4  HGB 14.3 15.6 14.1 14.8  HCT 44.8 46.0 43.7 45.7  MCV 86.3  --  85.4 85.3  PLT 202  --  183 189   Cardiac Enzymes: No results for input(s): CKTOTAL, CKMB,  CKMBINDEX, TROPONINI in the last 168 hours. BNP: Invalid input(s): POCBNP CBG: No results for input(s): GLUCAP in the last 168 hours. D-Dimer No results for input(s): DDIMER in the last 72 hours. Hgb A1c  Recent Labs  11/24/16 0429  HGBA1C 5.7*   Lipid Profile  Recent Labs  11/24/16 0429  CHOL 190  HDL 40*  LDLCALC 131*  TRIG 93  CHOLHDL 4.8   Thyroid function studies No results for input(s): TSH, T4TOTAL, T3FREE, THYROIDAB in the last 72 hours.  Invalid input(s): FREET3 Anemia work up No results for input(s): VITAMINB12, FOLATE, FERRITIN, TIBC, IRON, RETICCTPCT in the last 72 hours. Urinalysis No results found for: COLORURINE, APPEARANCEUR, LABSPEC, PHURINE, GLUCOSEU, HGBUR, BILIRUBINUR, KETONESUR, PROTEINUR, UROBILINOGEN, NITRITE, LEUKOCYTESUR Sepsis Labs Invalid input(s): PROCALCITONIN,  WBC,  LACTICIDVEN Microbiology No results found for this or any previous visit (from the past 240 hour(s)).  Time coordinating discharge: 35 minutes  SIGNED:  Merlene Laughter, DO [facility] 11/25/2016, 10:25 PM Pager [phone]  If 7PM-7AM, please contact night-coverage [email or web address] Password [email or web address]

## 2016-11-25 NOTE — Progress Notes (Signed)
STROKE TEAM PROGRESS NOTE   SUBJECTIVE (INTERVAL HISTORY) Pt eating lunch.  He is unsure how cocaine is positive in urine and says he may have smoked cocaine with cigerrets . Still has right arm leg tingling sensation.    OBJECTIVE Temp:  [98.3 F (36.8 C)-98.7 F (37.1 C)] 98.4 F (36.9 C) (08/31 1048) Pulse Rate:  [68-87] 87 (08/31 1048) Cardiac Rhythm: Normal sinus rhythm (08/31 0900) Resp:  [20] 20 (08/31 1048) BP: (98-132)/(57-105) 132/105 (08/31 1048) SpO2:  [96 %-98 %] 98 % (08/31 1048)  CBC:  Recent Labs Lab 11/23/16 1029  11/24/16 0431 11/25/16 0537  WBC 6.8  --  6.8 9.4  NEUTROABS 4.1  --   --  6.4  HGB 14.3  < > 14.1 14.8  HCT 44.8  < > 43.7 45.7  MCV 86.3  --  85.4 85.3  PLT 202  --  183 189  < > = values in this interval not displayed.  Basic Metabolic Panel:   Recent Labs Lab 11/24/16 0431 11/25/16 0537  NA 141 140  K 4.1 4.1  CL 107 107  CO2 28 25  GLUCOSE 88 92  BUN 11 15  CREATININE 1.10 1.15  CALCIUM 9.0 9.1  MG  --  1.9  PHOS  --  3.1    Lipid Panel:     Component Value Date/Time   CHOL 190 11/24/2016 0429   TRIG 93 11/24/2016 0429   HDL 40 (L) 11/24/2016 0429   CHOLHDL 4.8 11/24/2016 0429   VLDL 19 11/24/2016 0429   LDLCALC 131 (H) 11/24/2016 0429   HgbA1c:  Lab Results  Component Value Date   HGBA1C 5.7 (H) 11/24/2016   Urine Drug Screen:     Component Value Date/Time   LABOPIA NONE DETECTED 11/24/2016 1015   COCAINSCRNUR POSITIVE (A) 11/24/2016 1015   LABBENZ NONE DETECTED 11/24/2016 1015   AMPHETMU NONE DETECTED 11/24/2016 1015   THCU NONE DETECTED 11/24/2016 1015   LABBARB NONE DETECTED 11/24/2016 1015    Alcohol Level No results found for: ETH  IMAGING I have personally reviewed the radiological images below and agree with the radiology interpretations.  Ct Head Wo Contrast 11/23/2016 IMPRESSION: 1.  No acute intracranial abnormality. 2. Mild cerebral atrophy and chronic ischemic microvascular white matter  disease.  Mr Brain Wo Contrast 11/23/2016 IMPRESSION: 1. Subcentimeter acute infarction within left lateral thalamus. No acute hemorrhage. 2. Moderate chronic microvascular ischemic changes and parenchymal volume loss of the brain.   Ct Angio Head and neck W Or Wo Contrast 11/24/2016 IMPRESSION: 1. 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 2. Short segment atheromatous stenosis of up to 50% at the proximal right ICA. 3. No large vessel occlusion within the intracranial circulation. Moderate multifocal intracranial atheromatous disease as above. Most notable findings include short-segment moderate distal left M1 stenosis with moderate carotid siphon atherosclerotic disease. 4. **An incidental finding of potential clinical significance has been found. 6 mm right lower lobe pulmonary nodule, indeterminate. Non-contrast chest CT at 6-12 months is recommended. If the nodule is stable at time of repeat CT, then future CT at 18-24 months (from today's scan) is considered optional for low-risk patients, but is recommended for high-risk patients. This recommendation follows the consensus statement: Guidelines for Management of Incidental Pulmonary Nodules Detected on CT Images: From the Fleischner Society 2017; Radiology 2017; 284:228-243.** 5. Emphysema.   TTE EF 60-65% normal wall motion, G1DD.   PHYSICAL EXAM  Temp:  [98.3 F (36.8 C)-98.7 F (37.1 C)] 98.4 F (36.9 C) (08/31 1048) Pulse Rate:  [68-87] 87 (08/31 1048) Resp:  [20] 20 (08/31 1048) BP: (98-132)/(57-105) 132/105 (08/31 1048) SpO2:  [96 %-98 %] 98 % (08/31 1048)  General - Well nourished, well developed, in no apparent distress.  Ophthalmologic - Sharp disc margins OU.   Cardiovascular - Regular rate and rhythm.  Mental Status -  Level of arousal and orientation to time, place, and person were intact. Language including expression, naming, repetition, comprehension  was assessed and found intact. Fund of Knowledge was assessed and was intact.  Cranial Nerves II - XII - II - Visual field intact OU. III, IV, VI - Extraocular movements intact. V - Facial sensation intact bilaterally. VII - right nasolabial fold flattening. VIII - Hearing & vestibular intact bilaterally. X - Palate elevates symmetrically. XI - Chin turning & shoulder shrug intact bilaterally. XII - Tongue protrusion intact.  Motor Strength - The patient's strength was normal in all extremities and pronator drift was absent.  Bulk was normal and fasciculations were absent.   Motor Tone - Muscle tone was assessed at the neck and appendages and was normal.  Reflexes - The patient's reflexes were 1+ in all extremities and he had no pathological reflexes.  Sensory - Light touch, temperature/pinprick, vibration and proprioception were assessed and were symmetrical.    Coordination - The patient had normal movements in the hands and feet with no ataxia or dysmetria.  Tremor was absent.  Gait and Station - deferred   ASSESSMENT/PLAN [name] is a 76 y.o. male with history of HTN and smoking and prior alcohol use presenting with right sided face and arm and leg numbness and tingling. He did not receive IV t-PA due to out of window.  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  LDL 131  HgbA1c 5.7  lovenox for VTE prophylaxis Diet Heart Room service appropriate? Yes; Fluid consistency: Thin Diet - low sodium heart healthy  No antithrombotic prior to admission, now on aspirin 325 mg daily. Due to intracranial  stenosis, add plavix for DAPT for 3 months and then either ASA or plavix alone.    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, asymptomatic  CTA showed right ICA 50% and left ICA 75% stenosis  However, asymptomatic at this time (current stroke at PCA territory)  Needs to quit smoking  Needs to close follow up with [facility] as outpt   Cocaine abuse  UDS postive on cocaine  Cocaine cessation counseling provided  Pt is willing to quit  Hypertension  Stable  Permissive hypertension (OK if < 220/120) but gradually normalize in 5-7 days  Long-term BP goal normotensive  Hyperlipidemia  Home meds:  none  LDL 131, goal < 70  Add atorvastatin 80  Continue statin at discharge  Tobacco abuse  Current smoker  Smoking cessation counseling provided  Nicotine patch provided  Pt is willing to quit  Other Stroke Risk Factors  Advanced age  Prior history of ETOH abuse, advised to drink no more than 1-2 drink(s) a day  Other Active Problems  Pt interested in [facility] trial   Hospital day # 2  Neurology will sign off. Please call with questions. Pt will follow up with Darrol Angel, NP, at [facility] in about 6 weeks. Thanks for the consult.  Marvel Plan, MD PhD Stroke Neurology 11/26/2016 8:38 AM    To contact Stroke Continuity provider, please refer to [email or web address]. After hours, contact General Neurology

## 2016-11-25 NOTE — Care Management Note (Signed)
Case Management Note  Patient Details  Name: Jim EmoryWilliam Payne MRN: [id number] Date of Birth: 15-Dec-1940  Subjective/Objective:                    Action/Plan: Pt discharging home with self care. CM consulted for outpatient therapy. MD provided patient with script for outpatient PT. [facility]. [facility]  Pt states he has transportation home today.   Expected Discharge Date:  11/25/16               Expected Discharge Plan:  OP Rehab  In-House Referral:     Discharge planning Services  CM Consult  Post Acute Care Choice:    Choice offered to:     DME Arranged:    DME Agency:     HH Arranged:    HH Agency:     Status of Service:  Completed, signed off  If discussed at [employer] Length of Stay Meetings, dates discussed:    Additional Comments:  Kermit BaloKelli F Melvena Vink, RN 11/25/2016, 3:22 PM

## 2016-11-26 DIAGNOSIS — E785 Hyperlipidemia, unspecified: Secondary | ICD-10-CM

## 2016-11-26 DIAGNOSIS — F17200 Nicotine dependence, unspecified, uncomplicated: Secondary | ICD-10-CM

## 2016-11-26 DIAGNOSIS — R200 Anesthesia of skin: Secondary | ICD-10-CM

## 2016-11-26 DIAGNOSIS — F1410 Cocaine abuse, uncomplicated: Secondary | ICD-10-CM

## 2016-12-01 ENCOUNTER — Other Ambulatory Visit: Payer: Self-pay | Admitting: *Deleted

## 2016-12-01 NOTE — Patient Outreach (Signed)
[facility] Care Management  12/01/2016  Jim EmoryWilliam Payne 1940-05-03 [id number]  Referral via EMMI-Stroke -Red Alert-Day #1, 11/30/2016;  Reason-Scheduled a follow up appointment-no  Telephone call to patient who was advised of reason for call. HIPPA verification received.  Patient states he has [facility] [place] States he has transportation.    Patient states he still has some numbness in areas but not as bad as when he went to hospital. Review of stroke symptoms  & action plan reviewed with patient who voices understanding.   Patient states he is unable to talk any longer because he has appointment to attend.   EMMI call completed.  Plan: Send to care management assistant to close case.  Jim CanLinda Jennesis Ramaswamy, RN BSN CCM Care Management Coordinator [facility] Care Management  [phone]

## 2016-12-08 ENCOUNTER — Emergency Department (HOSPITAL_COMMUNITY)
Admission: EM | Admit: 2016-12-08 | Discharge: 2016-12-08 | Disposition: A | Discharge status: Home / Self Care | Payer: Non-veteran care | Attending: Emergency Medicine | Admitting: Emergency Medicine

## 2016-12-08 ENCOUNTER — Encounter (HOSPITAL_COMMUNITY): Payer: Self-pay

## 2016-12-08 DIAGNOSIS — F17200 Nicotine dependence, unspecified, uncomplicated: Secondary | ICD-10-CM | POA: Diagnosis not present

## 2016-12-08 DIAGNOSIS — Z79899 Other long term (current) drug therapy: Secondary | ICD-10-CM | POA: Diagnosis not present

## 2016-12-08 DIAGNOSIS — R200 Anesthesia of skin: Secondary | ICD-10-CM | POA: Diagnosis present

## 2016-12-08 DIAGNOSIS — Z8673 Personal history of transient ischemic attack (TIA), and cerebral infarction without residual deficits: Secondary | ICD-10-CM | POA: Diagnosis not present

## 2016-12-08 DIAGNOSIS — R202 Paresthesia of skin: Secondary | ICD-10-CM | POA: Insufficient documentation

## 2016-12-08 HISTORY — DX: Cerebral infarction, unspecified: I63.9

## 2016-12-08 LAB — BASIC METABOLIC PANEL
ANION GAP: 11 (ref 5–15)
BUN: 16 mg/dL (ref 6–20)
CALCIUM: 8.8 mg/dL — AB (ref 8.9–10.3)
CO2: 21 mmol/L — AB (ref 22–32)
CREATININE: 1.29 mg/dL — AB (ref 0.61–1.24)
Chloride: 107 mmol/L (ref 101–111)
GFR, EST NON AFRICAN AMERICAN: 53 mL/min — AB (ref >60)
Glucose, Bld: 107 mg/dL — ABNORMAL HIGH (ref 65–99)
Potassium: 4.5 mmol/L (ref 3.5–5.1)
SODIUM: 139 mmol/L (ref 135–145)

## 2016-12-08 LAB — CBC
HCT: 45.3 % (ref 39.0–52.0)
HEMOGLOBIN: 14.7 g/dL (ref 13.0–17.0)
MCH: 27.9 pg (ref 26.0–34.0)
MCHC: 32.5 g/dL (ref 30.0–36.0)
MCV: 86 fL (ref 78.0–100.0)
PLATELETS: 218 10*3/uL (ref 150–400)
RBC: 5.27 MIL/uL (ref 4.22–5.81)
RDW: 14.2 % (ref 11.5–15.5)
WBC: 9 10*3/uL (ref 4.0–10.5)

## 2016-12-08 NOTE — Discharge Instructions (Signed)
The numbness which you are having, in your face and right arm are because of the recent stroke.  There does not appear to be any change in your neurologic exam today.  Please continue to avoid using cocaine and try to cut back on her tobacco use.  Take your medications as previously recommended.  See your doctor next week, as scheduled.

## 2016-12-08 NOTE — ED Notes (Signed)
Pt updated on wait time. No new complaints.

## 2016-12-08 NOTE — ED Notes (Signed)
Patient updated on results and wait time.

## 2016-12-08 NOTE — ED Provider Notes (Signed)
[facility]-EMERGENCY DEPT Provider Note   CSN: [id number] Arrival date & time: 12/08/16  0935     History   Chief Complaint Chief Complaint  Patient presents with  . arm numbness/abd. pain    HPI Jim Payne is a 76 y.o. male.  He presents for evaluation of persistent numbness in his face and right arm.  He does not have any problems walking, talking, eating, changing clothes, and does not feel ill.  He is taking the recommended medicines at discharge.  He was discharged 11/25/16, after being evaluated and treated for an acute left thalamic stroke.  He has a follow-up appointment with his PCP, in 5 days time.  There are no other known modifying factors.   HPI  Past Medical History:  Diagnosis Date  . Stroke [facility])     Patient Active Problem List   Diagnosis Date Noted  . Numbness   . Cocaine abuse   . Smoker   . Hyperlipidemia   . Stroke [facility]) 11/23/2016    History reviewed. No pertinent surgical history.     Home Medications    Prior to Admission medications   Medication Sig Start Date End Date Taking? Authorizing Provider  aspirin 325 MG tablet Take 1 tablet (325 mg total) by mouth daily. 11/26/16   Jim Merles Latif, Jim Payne  atorvastatin (LIPITOR) 80 MG tablet Take 1 tablet (80 mg total) by mouth daily at 6 PM. 11/25/16   Jim Merles Latif, Jim Payne  clopidogrel (PLAVIX) 75 MG tablet Take 1 tablet (75 mg total) by mouth daily. 11/26/16   Jim Payne, Jim Latif, Jim Payne  senna-docusate (SENOKOT-S) 8.6-50 MG tablet Take 1 tablet by mouth at bedtime as needed for mild constipation. 11/25/16   Jim Laughter, Jim Payne    Family History No family history on file.  Social History Social History  Substance Use Topics  . Smoking status: Current Every Day Smoker  . Smokeless tobacco: Never Used  . Alcohol use Yes     Allergies   Patient has no known allergies.   Review of Systems Review of Systems  All other systems reviewed and are negative.    Physical Exam Updated Vital  Signs BP 109/61 (BP Location: Left Arm)   Pulse 84   Temp 97.9 F (36.6 C) (Oral)   Resp 16   SpO2 100%   Physical Exam  Constitutional: He is oriented to person, place, and time. He appears well-developed and well-nourished. No distress.  HENT:  Head: Normocephalic and atraumatic.  Right Ear: External ear normal.  Left Ear: External ear normal.  Eyes: Pupils are equal, round, and reactive to light. Conjunctivae and EOM are normal.  Neck: Normal range of motion and phonation normal. Neck supple.  Cardiovascular: Normal rate, regular rhythm and normal heart sounds.   Pulmonary/Chest: Effort normal and breath sounds normal. He exhibits no bony tenderness.  Abdominal: Soft. There is no tenderness.  Musculoskeletal: Normal range of motion.  Neurological: He is alert and oriented to person, place, and time. No sensory deficit. He exhibits normal muscle tone. Coordination normal.  No dysarthria, aphasia or nystagmus.  No light touch dysesthesia.  Very mild flattening of the right nasolabial fold.  Skin: Skin is warm, dry and intact.  Psychiatric: He has a normal mood and affect. His behavior is normal. Judgment and thought content normal.  Nursing note and vitals reviewed.    ED Treatments / Results  Labs (all labs ordered are listed, but only abnormal results are displayed) Labs Reviewed  BASIC  METABOLIC PANEL - Abnormal; Notable for the following:       Result Value   CO2 21 (*)    Glucose, Bld 107 (*)    Creatinine, Ser 1.29 (*)    Calcium 8.8 (*)    GFR calc non Af Amer 53 (*)    All other components within normal limits  CBC  URINALYSIS, ROUTINE W REFLEX MICROSCOPIC  HEPATIC FUNCTION PANEL  LIPASE, BLOOD    EKG  EKG Interpretation None       Radiology No results found.  Procedures Procedures (including critical care time)  Medications Ordered in ED Medications - No data to display   Initial Impression / Assessment and Plan / ED Course  I have reviewed  the triage vital signs and the nursing notes.  Pertinent labs & imaging results that were available during my care of the patient were reviewed by me and considered in my medical decision making (see chart for details).      Patient Vitals for the past 24 hrs:  BP Temp Temp src Pulse Resp SpO2  12/08/16 1236 109/61 97.9 F (36.6 C) Oral 84 16 100 %  12/08/16 0941 (!) 152/86 98.1 F (36.7 C) Oral 88 18 99 %    5:08 PM Reevaluation with update and discussion. After initial assessment and treatment, an updated evaluation reveals no change in clinical status.  Findings discussed with patient, and all questions were answered. Venda Dice L    Final Clinical Impressions(s) / ED Diagnoses   Final diagnoses:  Paresthesia    Persistent symptoms post recent stroke, without change in clinical neurologic status.  No indication for further evaluation or intervention at this time.  Nursing Notes Reviewed/ Care Coordinated Applicable Imaging Reviewed Interpretation of Laboratory Data incorporated into ED treatment  The patient appears reasonably screened and/or stabilized for discharge and I doubt any other medical condition or other [facility] requiring further screening, evaluation, or treatment in the ED at this time prior to discharge.  Plan: Home Medications-continue current medications; Home Treatments-avoid tobacco and cocaine, rest, fluids; return here if the recommended treatment, does not improve the symptoms; Recommended follow up-PCP, as scheduled    New Prescriptions New Prescriptions   No medications on file     Jim Payne, Jim Grassia, MD 12/08/16 [phone]

## 2016-12-08 NOTE — ED Triage Notes (Signed)
Patient complains of 2 days of right upper arm numbness with RUQ pain if he touches his skin. Pain with palpitation. No arm drift, speech clear, no facial droop. Alert and oriented

## 2016-12-09 ENCOUNTER — Other Ambulatory Visit: Payer: Self-pay | Admitting: *Deleted

## 2016-12-09 NOTE — Patient Outreach (Signed)
[facility]) Care Management  12/09/2016  Jim Payne 06-01-1940 [id number]  EMMI-Stroke referral-Red Alert, Day #9, 12/08/2016: Reason: Feeling worse; new or worsening condition:  Telephone call to patient who gave HIPPA verification.  Patient states he is feeling well today.  Verifies that he did drive himself to emergency room on 9/13 due to symptoms of numbness. States he has follow up appointment [facility] [place] Patient advised that he should 911 if he experiences stroke symptoms. Patient voices that he is feeling ok and does not wish to receive any more automated calls.  States he has no concerns & wishes not to receive any further calls.   EMMI calls completed.   Plan: Send to care management assistant to close case & discontinue EMMI stroke calls.   Colleen Can, RN BSN CCM Care Management Coordinator [facility] Care Management  [phone]

## 2017-02-21 ENCOUNTER — Encounter: Payer: Self-pay | Admitting: Neurology

## 2017-02-21 ENCOUNTER — Ambulatory Visit (INDEPENDENT_AMBULATORY_CARE_PROVIDER_SITE_OTHER): Payer: Non-veteran care | Admitting: Neurology

## 2017-02-21 VITALS — BP 145/88 | HR 91 | Ht 72.0 in | Wt 157.0 lb

## 2017-02-21 DIAGNOSIS — I63332 Cerebral infarction due to thrombosis of left posterior cerebral artery: Secondary | ICD-10-CM | POA: Insufficient documentation

## 2017-02-21 DIAGNOSIS — I10 Essential (primary) hypertension: Secondary | ICD-10-CM

## 2017-02-21 DIAGNOSIS — F1410 Cocaine abuse, uncomplicated: Secondary | ICD-10-CM | POA: Diagnosis not present

## 2017-02-21 DIAGNOSIS — F17200 Nicotine dependence, unspecified, uncomplicated: Secondary | ICD-10-CM

## 2017-02-21 DIAGNOSIS — F1210 Cannabis abuse, uncomplicated: Secondary | ICD-10-CM | POA: Insufficient documentation

## 2017-02-21 DIAGNOSIS — R200 Anesthesia of skin: Secondary | ICD-10-CM | POA: Diagnosis not present

## 2017-02-21 MED ORDER — DULOXETINE HCL 60 MG PO CPEP: 60.0000 mg | ORAL_CAPSULE | Freq: Every day | ORAL | 5 refills | Status: DC

## 2017-02-21 MED ORDER — DULOXETINE HCL 30 MG PO CPEP: 30.0000 mg | ORAL_CAPSULE | Freq: Every day | ORAL | 0 refills | Status: DC

## 2017-02-21 NOTE — Progress Notes (Signed)
STROKE NEUROLOGY FOLLOW UP NOTE  NAME: Jim Payne DOB: [birth year]  REASON FOR VISIT: stroke follow up HISTORY FROM: Patient and chart  Today we had the pleasure of seeing Jim Payne in follow-up at our Neurology Clinic. Pt was accompanied by no one.   History Summary Jim Payne is a 76 y.o. male with history of HTN, cocaine use, smoking and prior alcohol use admitted on 11/23/16 for right sided face and arm and leg numbness and tingling. CT head no acute abnormality. MRI showed acute left thalamic infarct.  CT head and neck left ICA proximal 75% and right ICA proximal 50% stenosis, left M1 short segment stenosis and left CCA origin moderate stenosis.  TTE unremarkable, LDL 131 and A1c 5.7.  UDS showed positive cocaine. Due to intracranial stenosis, he was discharged with dual antiplatelet and high-dose Lipitor.  Interval History During the interval time, the patient has been doing well.  However, continued to complain of right-sided numbness, no change from initial presentation.  [facility], tried gabapentin, but discontinued due to sleepy and drowsy side effect.  Still smoking, 1/3 PPD.  Denies any further cocaine or THC use.  Still on dual antiplatelet and Lipitor now.  REVIEW OF SYSTEMS: Full 14 system review of systems performed and notable only for those listed below and in HPI above, all others are negative:  Constitutional:   Cardiovascular:  Ear/Nose/Throat:   Skin:  Eyes:   Respiratory:   Gastroitestinal:   Genitourinary: Blood in urine Hematology/Lymphatic: Easy bruising Endocrine:  Musculoskeletal:   Allergy/Immunology:   Neurological: Numbness Psychiatric: Depression, decreased energy Sleep:   The following represents the patient's updated allergies and side effects list: No Known Allergies  The neurologically relevant items on the patient's problem list were reviewed on today's visit.  Neurologic Examination  A problem  focused neurological exam (12 or more points of the single system neurologic examination, vital signs counts as 1 point, cranial nerves count for 8 points) was performed.  Blood pressure (!) 145/88, pulse 91, height 6' (1.829 m), weight 157 lb (71.2 kg).  General - Well nourished, well developed, in no apparent distress.  Ophthalmologic - Sharp disc margins OU.   Cardiovascular - Regular rate and rhythm.  Mental Status -  Level of arousal and orientation to time, place, and person were intact. Language including expression, naming, repetition, comprehension was assessed and found intact. Fund of Knowledge was assessed and was intact.  Cranial Nerves II - XII - II - Visual field intact OU. III, IV, VI - Extraocular movements intact. V - Facial sensation intact bilaterally. VII - Facial movement intact bilaterally. VIII - Hearing & vestibular intact bilaterally. X - Palate elevates symmetrically. XI - Chin turning & shoulder shrug intact bilaterally. XII - Tongue protrusion intact.  Motor Strength - The patient's strength was normal in all extremities and pronator drift was absent.  Bulk was normal and fasciculations were absent.   Motor Tone - Muscle tone was assessed at the neck and appendages and was normal.  Reflexes - The patient's reflexes were 1+ in all extremities and he had no pathological reflexes.  Sensory - Light touch, temperature/pinprick were assessed and were decreased on the right.    Coordination - The patient had normal movements in the hands with no ataxia or dysmetria.  Tremor was absent.  Gait and Station - The patient's transfers, posture, gait, station, and turns were observed as normal.   Functional score  mRS =  1   0 - No symptoms.   1 - No significant disability. Able to carry out all usual activities, despite some symptoms.   2 - Slight disability. Able to look after own affairs without assistance, but unable to carry out all previous  activities.   3 - Moderate disability. Requires some help, but able to walk unassisted.   4 - Moderately severe disability. Unable to attend to own bodily needs without assistance, and unable to walk unassisted.   5 - Severe disability. Requires constant nursing care and attention, bedridden, incontinent.   6 - Dead.   NIH Stroke Scale = 1  Level Of Consciousness 0=Alert; keenly responsive 1=Not alert, but arousable by minor stimulation 2=Not alert, requires repeated stimulation 3=Responds only with reflex movements 0  LOC Questions to Month and Age 49=Answers both questions correctly 1=Answers one question correctly 2=Answers neither question correctly 0  LOC Commands      -Open/Close eyes     -Open/close grip 0=Performs both tasks correctly 1=Performs one task correctly 2=Performs neighter task correctly 0  Best Gaze 0=Normal 1=Partial gaze palsy 2=Forced deviation, or total gaze paresis 0  Visual 0=No visual loss 1=Partial hemianopia 2=Complete hemianopia 3=Bilateral hemianopia (blind including cortical blindness) 0  Facial Palsy 0=Normal symmetrical movement 1=Minor paralysis (asymmetry) 2=Partial paralysis (lower face) 3=Complete paralysis (upper and lower face) 0  Motor  0=No drift, limb holds posture for full 10 seconds 1=Drift, limb holds posture, no drift to bed 2=Some antigravity effort, cannot maintain posture, drifts to bed 3=No effort against gravity, limb falls 4=No movement Right Arm 0     Leg 0    Left Arm 0     Leg 0  Limb Ataxia 0=Absent 1=Present in one limb 2=Present in two limbs 0  Sensory 0=Normal 1=Mild to moderate sensory loss 2=Severe to total sensory loss 1  Best Language 0=No aphasia, normal 1=Mild to moderate aphasia 2=Mute, global aphasia 3=Mute, global aphasia 0  Dysarthria 0=Normal 1=Mild to moderate 2=Severe, unintelligible or mute/anarthric 0  Extinction/Neglect 0=No abnormality 1=Extinction to bilateral simultaneous  stimulation 2=Profound neglect 0  Total   1     Data reviewed: I personally reviewed the images and agree with the radiology interpretations.   Ct Head Wo Contrast 11/23/2016 IMPRESSION: 1.  No acute intracranial abnormality. 2. 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  Mr Brain Wo Contrast 11/23/2016 IMPRESSION: 1. Subcentimeter acute infarction within left lateral thalamus. No acute hemorrhage. 2. Moderate chronic microvascular ischemic changes and parenchymal volume loss of the brain.   Ct Angio Head and neck W Or Wo Contrast 11/24/2016 IMPRESSION: 1. 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 2. Short segment atheromatous stenosis of up to 50% at the proximal right ICA. 3. No large vessel occlusion within the intracranial circulation. Moderate multifocal intracranial atheromatous disease as above. Most notable findings include short-segment moderate distal left M1 stenosis with moderate carotid siphon atherosclerotic disease. 4. **An incidental finding of potential clinical significance has been found. 6 mm right lower lobe pulmonary nodule, indeterminate. Non-contrast chest CT at 6-12 months is recommended. If the nodule is stable at time of repeat CT, then future CT at 18-24 months (from today's scan) is considered optional for low-risk patients, but is recommended for high-risk patients. This recommendation follows the consensus statement: Guidelines for Management of Incidental Pulmonary Nodules Detected on CT Images: From the Fleischner Society 2017; Radiology 2017; 284:228-243.** 5. Emphysema.  TTE EF 60-65% normal wall motion, G1DD.  Component     Latest Ref Rng & Units 11/24/2016  Cholesterol     0 - 200 mg/dL 604190  Triglycerides     <150 mg/dL 93  HDL Cholesterol     >40 mg/dL 40 (L)  Total CHOL/HDL Ratio     RATIO 4.8  VLDL     0 - 40 mg/dL 19  LDL (calc)     0 - 99  mg/dL 540131 (H)  Opiates     NONE DETECTED NONE DETECTED  COCAINE     NONE DETECTED POSITIVE (A)  Benzodiazepines     NONE DETECTED NONE DETECTED  Amphetamines     NONE DETECTED NONE DETECTED  Tetrahydrocannabinol     NONE DETECTED NONE DETECTED  Barbiturates     NONE DETECTED NONE DETECTED  Hemoglobin A1C     4.8 - 5.6 % 5.7 (H)  Mean Plasma Glucose     mg/dL [phone]    Assessment: As you may recall, he is a 76 y.o. Caucasian male with PMH of HTN, cocaine use, smoking and prior alcohol use admitted on 11/23/16 for right sided face and arm and leg numbness and tingling. MRI showed acute left thalamic infarct.  CT head and neck left ICA proximal 75% and right ICA proximal 50% stenosis, left M1 short segment stenosis and left CCA origin moderate stenosis.  TTE unremarkable, LDL 131 and A1c 5.7.  UDS showed positive cocaine. Due to intracranial stenosis, he was discharged with dual antiplatelet and high-dose Lipitor. However, continued to complain of right-sided numbness, no change from initial presentation.  He [facility], tried gabapentin, but discontinued due to sleepy and drowsy side effect.  Still smoking, 1/3 PPD.  Denies any further cocaine or THC use.  Still on dual antiplatelet and Lipitor now.  Plan:  - continue plavix and lipitor for stroke prevention - discontinue ASA for now - Follow up with your primary care physician for stroke risk factor modification. Recommend maintain blood pressure goal <120-140/80, diabetes with hemoglobin A1c goal below 7.0% and lipids with LDL cholesterol goal below 70 mg/dL.  - quit smoking - avoid cocaine use - prescribe cymbalta for left sided numbness, please get from [facility].  - check BP at home and record - healthy diet and regular exercise - follow up in [facility] in the future.    No orders of the defined types were placed in this encounter.   Meds ordered this encounter  Medications  . DULoxetine (CYMBALTA) 30 MG capsule     Sig: Take 1 capsule (30 mg total) by mouth daily.    Dispense:  7 capsule    Refill:  0  . DULoxetine (CYMBALTA) 60 MG capsule    Sig: Take 1 capsule (60 mg total) by mouth daily.    Dispense:  30 capsule    Refill:  5    Patient Instructions  - continue plavix and lipitor for stroke prevention - discontinue ASA for now - Follow up with your primary care physician for stroke risk factor modification. Recommend maintain blood pressure goal <120-140/80, diabetes with hemoglobin A1c goal below 7.0% and lipids with LDL cholesterol goal below 70 mg/dL.  - quit smoking - avoid cocaine use - prescribe cymbalta for left sided numbness, please get from [facility].  - check BP at home and record - healthy diet and regular exercise - follow up in [facility] in the future.     Edrees Valent  Erlinda Hong, MD PhD [facility] [address] [place], [place] [address] [phone]

## 2017-02-21 NOTE — Patient Instructions (Addendum)
-   continue plavix and lipitor for stroke prevention - discontinue ASA for now - Follow up with your primary care physician for stroke risk factor modification. Recommend maintain blood pressure goal <120-140/80, diabetes with hemoglobin A1c goal below 7.0% and lipids with LDL cholesterol goal below 70 mg/dL.  - quit smoking - avoid cocaine use - prescribe cymbalta for left sided numbness, please get from [facility].  - check BP at home and record - healthy diet and regular exercise - follow up in [facility] in the future.

## 2017-02-22 ENCOUNTER — Encounter: Payer: Non-veteran care | Admitting: Vascular Surgery

## 2017-03-01 ENCOUNTER — Encounter: Payer: Non-veteran care | Admitting: Vascular Surgery

## 2017-03-13 ENCOUNTER — Telehealth: Payer: Self-pay | Admitting: Neurology

## 2017-03-13 NOTE — Telephone Encounter (Signed)
Rn call patient about a letter needed for the [facility]. Pt stated the [facility] will not accept [facility] that was given to him. The letter has to be letterhead that he was seen 02/21/2017 by Dr. Roda ShuttersXu for a office visit. RN stated DR. Roda ShuttersXu is in the hospital this week working. Pt began to say he needs it by Mar 23, 2017. Rn stated a message will be sent to Dr. Roda ShuttersXu.

## 2017-03-13 NOTE — Telephone Encounter (Signed)
Patient requesting a letter on [facility] letterhead stating he was seen by Dr. Roda ShuttersXu on 02-21-17 for travel expense purposes. Please mail to patient.

## 2017-03-14 ENCOUNTER — Telehealth: Payer: Self-pay

## 2017-03-14 ENCOUNTER — Encounter: Payer: Self-pay | Admitting: Neurology

## 2017-03-14 NOTE — Telephone Encounter (Signed)
Will do. Thanks.   Marvel PlanJindong Deissy Guilbert, MD PhD Stroke Neurology 03/14/2017 5:54 PM

## 2017-03-14 NOTE — Telephone Encounter (Signed)
Patient no show for appt today. 

## 2017-03-15 NOTE — Telephone Encounter (Signed)
Letter put in the mail

## 2017-03-15 NOTE — Telephone Encounter (Signed)
Rn call patient that the letter that he was seen is ready. Pt stated to put it in the mail, he does not have any gas to come get the letter. Rn verified address, and it was put it in the mail. Pt verbalized understanding.

## 2017-03-29 ENCOUNTER — Encounter: Payer: Self-pay | Admitting: Vascular Surgery

## 2017-03-29 ENCOUNTER — Ambulatory Visit (INDEPENDENT_AMBULATORY_CARE_PROVIDER_SITE_OTHER): Payer: Non-veteran care | Admitting: Vascular Surgery

## 2017-03-29 VITALS — BP 144/81 | HR 84 | Temp 99.0°F | Resp 16 | Ht 72.0 in | Wt 156.0 lb

## 2017-03-29 DIAGNOSIS — I739 Peripheral vascular disease, unspecified: Secondary | ICD-10-CM

## 2017-03-29 DIAGNOSIS — I6522 Occlusion and stenosis of left carotid artery: Secondary | ICD-10-CM | POA: Diagnosis not present

## 2017-03-29 MED ORDER — CLOPIDOGREL BISULFATE 75 MG PO TABS: 75.0000 mg | ORAL_TABLET | Freq: Every day | ORAL | 6 refills | Status: AC

## 2017-03-29 NOTE — Progress Notes (Signed)
Patient name: Jim Payne MRN: [id number] DOB: [birth year] Sex: male   REASON FOR CONSULT:    Carotid disease.  The consult is requested by the [place].  HPI:   Jim Payne is a pleasant 77 y.o. male, who had a left thalamic infarct in August of this year.  He had a moderate left carotid stenosis and also had intracranial disease.  He was treated medically.  He comes in for vascular consultation.  He did have a duplex and CT angiogram back at the time of his admission but has not had a follow-up duplex.  Since his stroke in August, he denies any new neurologic symptoms.  He has some persistent paresthesias in the right arm and leg.  He has had no new weakness or paresthesias.  He denies any expressive or receptive aphasia or amaurosis fugax.  He was on Plavix and told to stop taking aspirin.  However he has run out of his medications and is no longer taking Plavix or aspirin.  He denies any claudication, rest pain, or nonhealing ulcers.  I reviewed the note from Dr. Roda Payne on 02/21/2017.  The patient was admitted in August with right sided facial numbness and tingling in addition to numbness in the right arm and leg.  MRI showed an acute left thalamic infarct.  CT angiogram of the neck showed a 75% left internal carotid artery stenosis and a 50% right internal carotid artery stenosis.  A drug screen at that time was positive for cocaine.  The patient did have intracranial disease and was discharged on dual antiplatelet therapy and high-dose Lipitor.  Past Medical History:  Diagnosis Date  . Stroke [facility])   The patient's past medical history is significant for hypertension, history of cocaine use, prior alcohol use, and tobacco use.  No family history on file. He denies any family history of premature cardiovascular disease.  SOCIAL HISTORY: He smokes a third of a pack per day of cigarettes. Social History   Socioeconomic History  . Marital status: Single    Spouse name: Not on file  .  Number of children: Not on file  . Years of education: Not on file  . Highest education level: Not on file  Social Needs  . Financial resource strain: Not on file  . Food insecurity - worry: Not on file  . Food insecurity - inability: Not on file  . Transportation needs - medical: Not on file  . Transportation needs - non-medical: Not on file  Occupational History  . Not on file  Tobacco Use  . Smoking status: Current Every Day Smoker  . Smokeless tobacco: Never Used  . Tobacco comment: 1 pk last 3 days.  Substance and Sexual Activity  . Alcohol use: Yes  . Drug use: Yes    Types: Marijuana    Comment: occasionally  . Sexual activity: Not on file  Other Topics Concern  . Not on file  Social History Narrative  . Not on file    No Known Allergies  Current Outpatient Medications  Medication Sig Dispense Refill  . senna-docusate (SENOKOT-S) 8.6-50 MG tablet Take 1 tablet by mouth at bedtime as needed for mild constipation. 30 tablet 0  . atorvastatin (LIPITOR) 80 MG tablet Take 1 tablet (80 mg total) by mouth daily at 6 PM. (Patient not taking: Reported on 03/29/2017) 30 tablet 0  . clopidogrel (PLAVIX) 75 MG tablet Take 1 tablet (75 mg total) by mouth daily. (Patient not taking: Reported on 03/29/2017) 30 tablet  0  . DULoxetine (CYMBALTA) 30 MG capsule Take 1 capsule (30 mg total) by mouth daily. (Patient not taking: Reported on 03/29/2017) 7 capsule 0  . DULoxetine (CYMBALTA) 60 MG capsule Take 1 capsule (60 mg total) by mouth daily. (Patient not taking: Reported on 03/29/2017) 30 capsule 5   No current facility-administered medications for this visit.     REVIEW OF SYSTEMS:  [X]  denotes positive finding, [ ]  denotes negative finding Cardiac  Comments:  Chest pain or chest pressure:    Shortness of breath upon exertion: X   Short of breath when lying flat:    Irregular heart rhythm:        Vascular    Pain in calf, thigh, or hip brought on by ambulation:    Pain in feet at  night that wakes you up from your sleep:     Blood clot in your veins:    Leg swelling:         Pulmonary    Oxygen at home:    Productive cough:     Wheezing:         Neurologic    Sudden weakness in arms or legs:     Sudden numbness in arms or legs:  X   Sudden onset of difficulty speaking or slurred speech:    Temporary loss of vision in one eye:     Problems with dizziness:         Gastrointestinal    Blood in stool:     Vomited blood:         Genitourinary    Burning when urinating:     Blood in urine:        Psychiatric    Major depression:         Hematologic    Bleeding problems:    Problems with blood clotting too easily:        Skin    Rashes or ulcers:        Constitutional    Fever or chills:     PHYSICAL EXAM:   Vitals:   03/29/17 1454 03/29/17 1457  BP: 133/83 (!) 144/81  Pulse: 84   Resp: 16   Temp: 99 F (37.2 C)   TempSrc: Oral   SpO2: 98%   Weight: 156 lb (70.8 kg)   Height: 6' (1.829 m)     GENERAL: The patient is a well-nourished male, in no acute distress. The vital signs are documented above. CARDIAC: There is a regular rate and rhythm.  VASCULAR: I do not detect carotid bruits. He has palpable femoral pulses. I cannot palpate popliteal or pedal pulses. PULMONARY: There is good air exchange bilaterally without wheezing or rales. ABDOMEN: Soft and non-tender with normal pitched bowel sounds.  I cannot palpate an abdominal aortic aneurysm. MUSCULOSKELETAL: There are no major deformities or cyanosis. NEUROLOGIC: He has no focal weakness in the upper extremities or lower extremities. SKIN: There are no ulcers or rashes noted. PSYCHIATRIC: The patient has a normal affect.  DATA:    ECHO: I have reviewed the echo that was done in August 2018.  This showed that systolic function was normal.  The estimated ejection fraction was 60-65%.  There was grade 1 diastolic dysfunction.  CAROTID DUPLEX: I did review the carotid duplex scan that  was done on 01/12/2017.  This showed that on the left side there was fairly extensive mixed plaque at the carotid bifurcation.  The peak systolic velocity was 186 cm/s and the  left internal carotid artery.  The end-diastolic velocity was within normal limits.  CT ANGIOGRAM: I reviewed the CT angiogram that was done on 11/24/2016.  This shows a 75% left internal carotid artery stenosis.  There was a 50% proximal right internal carotid artery stenosis.  There was also multifocal intracranial atherosclerotic disease most notably including a stenosis in the distal left M1 segment and moderate carotid siphon stenosis.  MEDICAL ISSUES:   LEFT BRAIN STROKE: This patient had a left thalamic infarct in August has a moderate left carotid stenosis.  He has not had any follow-up studies since August.  He has had no new neurologic symptoms.  He ran out of his medications and is no longer on Plavix or aspirin.  I have written him a prescription for Plavix.  I have instructed him to begin taking aspirin immediately until he gets his Plavix prescription.  We have discussed the importance of tobacco cessation.  I have ordered a follow-up carotid duplex scan in April which will be 6 months from his previous study.  Given that he is stable on medical therapy I would not recommend left carotid endarterectomy at this point unless the stenosis progressed to greater than 80% or he develop new left hemispheric symptoms.  He certainly would be at slightly increased risk for carotid endarterectomy given his intracranial disease.  I will see him back in April.  He knows to call sooner if he has problems.  Waverly Ferrari [facility] [phone]

## 2017-03-30 NOTE — Addendum Note (Signed)
Addended by: Burton ApleyPETTY, Lysa Livengood A on: 03/30/2017 11:21 AM   Modules accepted: Orders

## 2017-03-31 ENCOUNTER — Encounter: Payer: Self-pay | Admitting: Family Medicine

## 2017-06-28 ENCOUNTER — Encounter (HOSPITAL_COMMUNITY): Payer: Non-veteran care

## 2017-06-28 ENCOUNTER — Ambulatory Visit: Payer: Non-veteran care | Admitting: Vascular Surgery

## 2017-10-12 ENCOUNTER — Telehealth: Payer: Self-pay | Admitting: Neurology

## 2017-10-12 NOTE — Telephone Encounter (Signed)
Pt is asking for a call to clarify what he needs to do about getting a renewed handicap placard.  Pt states his doctor at the [facility] told him to see his Neurologist.  Pt made aware that Dr Roda ShuttersXu is no longer with the practice but that a message would be sent to his previous RN to advise what he needs to do about his placard that will expire the end of July.

## 2017-10-12 NOTE — Telephone Encounter (Signed)
Pt was called after phone rep spoke with RN Katrina.  Pt confirmed it was Dr [facility]Pantera [facility] that did his initial Handicap placard application.  Pt was informed that is who he needs to f/u with re: his request for a renewal.  Pt stated thank you for the information and call ended.  No call back has been requested

## 2017-10-12 NOTE — Telephone Encounter (Signed)
DR Roda ShuttersXu did not do hadicap placard. Pt needs to contact MD who did the original handicap sticker for renewal.

## 2018-01-31 ENCOUNTER — Ambulatory Visit (INDEPENDENT_AMBULATORY_CARE_PROVIDER_SITE_OTHER): Payer: No Typology Code available for payment source | Admitting: Vascular Surgery

## 2018-01-31 ENCOUNTER — Ambulatory Visit (INDEPENDENT_AMBULATORY_CARE_PROVIDER_SITE_OTHER)
Admission: RE | Admit: 2018-01-31 | Discharge: 2018-01-31 | Disposition: A | Discharge status: Home / Self Care | Payer: Non-veteran care | Source: Ambulatory Visit | Attending: Vascular Surgery | Admitting: Vascular Surgery

## 2018-01-31 ENCOUNTER — Other Ambulatory Visit: Payer: Self-pay

## 2018-01-31 ENCOUNTER — Ambulatory Visit (HOSPITAL_COMMUNITY)
Admission: RE | Admit: 2018-01-31 | Discharge: 2018-01-31 | Disposition: A | Discharge status: Home / Self Care | Payer: Non-veteran care | Source: Ambulatory Visit | Attending: Vascular Surgery | Admitting: Vascular Surgery

## 2018-01-31 ENCOUNTER — Encounter: Payer: Self-pay | Admitting: Vascular Surgery

## 2018-01-31 VITALS — BP 132/86 | HR 88 | Temp 97.9°F | Resp 16 | Ht 72.0 in | Wt 150.0 lb

## 2018-01-31 DIAGNOSIS — I6522 Occlusion and stenosis of left carotid artery: Secondary | ICD-10-CM | POA: Diagnosis not present

## 2018-01-31 DIAGNOSIS — I739 Peripheral vascular disease, unspecified: Secondary | ICD-10-CM

## 2018-01-31 NOTE — Progress Notes (Signed)
Patient name: Jim Payne MRN: [id number] DOB: [birth year] Sex: male  REASON FOR VISIT:   Follow-up of left carotid stenosis and also peripheral vascular disease.  HPI:   Jim Payne is a pleasant 77 y.o. male who I saw in consultation on 03/29/2017.  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  He had a moderate left carotid stenosis and also had intracranial disease.  This was treated medically.  Previous CT angiogram of the neck showed a 75% left internal carotid artery stenosis with a 50% internal carotid artery stenosis.  When I saw him and had no new neurologic symptoms.  He had run out of his medications and was no longer on Plavix and aspirin.  I wrote him a prescription for Plavix.  I set him up for a six-month follow-up visit.  Since I saw him last he does continue to have some right arm weakness which is chronic.  He has had no new focal weakness or paresthesias in his arms or legs.  He denies any expressive or receptive aphasia.  He denies any amaurosis fugax.  He does continue to smoke about a third a pack per day.  He is on Plavix and a statin.  He denies claudication, rest pain, or nonhealing ulcers.  Past Medical History:  Diagnosis Date  . Stroke [facility])     No family history on file.  SOCIAL HISTORY: Social History   Tobacco Use  . Smoking status: Current Every Day Smoker  . Smokeless tobacco: Never Used  . Tobacco comment: 1 pk last 3 days.  Substance Use Topics  . Alcohol use: Yes    No Known Allergies  Current Outpatient Medications  Medication Sig Dispense Refill  . atorvastatin (LIPITOR) 80 MG tablet Take 1 tablet (80 mg total) by mouth daily at 6 PM. 30 tablet 0  . clopidogrel (PLAVIX) 75 MG tablet Take 1 tablet (75 mg total) by mouth daily. 30 tablet 6   No current facility-administered medications for this visit.     REVIEW OF SYSTEMS:  [X]  denotes positive finding, [ ]  denotes negative finding Cardiac  Comments:  Chest pain or chest  pressure:    Shortness of breath upon exertion:    Short of breath when lying flat:    Irregular heart rhythm:        Vascular    Pain in calf, thigh, or hip brought on by ambulation:    Pain in feet at night that wakes you up from your sleep:     Blood clot in your veins:    Leg swelling:         Pulmonary    Oxygen at home:    Productive cough:     Wheezing:         Neurologic    Sudden weakness in arms or legs:     Sudden numbness in arms or legs:     Sudden onset of difficulty speaking or slurred speech:    Temporary loss of vision in one eye:     Problems with dizziness:         Gastrointestinal    Blood in stool:     Vomited blood:         Genitourinary    Burning when urinating:     Blood in urine:        Psychiatric    Major depression:         Hematologic    Bleeding problems:    Problems  with blood clotting too easily:        Skin    Rashes or ulcers:        Constitutional    Fever or chills:     PHYSICAL EXAM:   Vitals:   01/31/18 0928 01/31/18 0929  BP: 126/71 132/86  Pulse: 88   Resp: 16   Temp: 97.9 F (36.6 C)   TempSrc: Oral   SpO2: 99%   Weight: 150 lb (68 kg)   Height: 6' (1.829 m)     GENERAL: The patient is a well-nourished male, in no acute distress. The vital signs are documented above. CARDIAC: There is a regular rate and rhythm.  VASCULAR: He has bilateral carotid bruits. On the right side he has a palpable femoral and posterior tibial pulse. On the left side I cannot palpate a pedal pulse. He has no significant lower extremity swelling. PULMONARY: There is good air exchange bilaterally without wheezing or rales. ABDOMEN: Soft and non-tender with normal pitched bowel sounds.  MUSCULOSKELETAL: There are no major deformities or cyanosis. NEUROLOGIC: No focal weakness or paresthesias are detected. SKIN: There are no ulcers or rashes noted. PSYCHIATRIC: The patient has a normal affect.  DATA:    CAROTID DUPLEX: I have  independently interpreted his carotid duplex scan today.  On the right side he has a less than 39% right carotid stenosis.  The right vertebral artery has antegrade flow.  On the left side he has a 60 to 79% mid left ICA stenosis.  He has antegrade flow in the left vertebral artery.  ARTERIAL DOPPLER STUDY: I have independently interpreted his arterial Doppler study today.  On the right side he has a triphasic dorsalis pedis and posterior tibial signal with an ABI of 100% and a toe pressure of 129 mmHg.  On the left side he has a monophasic dorsalis pedis and posterior tibial signal with an ABI of 73%.  MEDICAL ISSUES:   60 TO 79% LEFT CAROTID STENOSIS: The patient has had no new neurologic symptoms so we are continue to follow his left carotid stenosis.  I would only consider left carotid endarterectomy if you develop new left hemispheric symptoms or the stenosis progressed to greater than 80%.  He is on Plavix and is on a statin.  We have again discussed the importance of tobacco cessation.  I have ordered a follow-up carotid duplex scan in 6 months and I will see him back at that time.  PERIPHERAL VASCULAR DISEASE: He does have evidence of moderate infrainguinal arterial occlusive disease on the left.  However, he is asymptomatic so I would not recommend any further work-up for this.  I have encouraged him to walk is much as possible and again we have discussed the importance of tobacco cessation.  Waverly Ferrari [facility] [phone]

## 2018-02-26 ENCOUNTER — Ambulatory Visit (INDEPENDENT_AMBULATORY_CARE_PROVIDER_SITE_OTHER): Payer: No Typology Code available for payment source | Admitting: Neurology

## 2018-02-26 ENCOUNTER — Encounter: Payer: Self-pay | Admitting: Neurology

## 2018-02-26 VITALS — BP 131/86 | HR 93 | Ht 72.0 in | Wt 151.0 lb

## 2018-02-26 DIAGNOSIS — I6930 Unspecified sequelae of cerebral infarction: Secondary | ICD-10-CM | POA: Diagnosis not present

## 2018-02-26 DIAGNOSIS — R200 Anesthesia of skin: Secondary | ICD-10-CM | POA: Diagnosis not present

## 2018-02-26 DIAGNOSIS — I6381 Other cerebral infarction due to occlusion or stenosis of small artery: Secondary | ICD-10-CM

## 2018-02-26 DIAGNOSIS — I639 Cerebral infarction, unspecified: Secondary | ICD-10-CM | POA: Diagnosis not present

## 2018-02-26 NOTE — Progress Notes (Signed)
[facility] [address] [place]. [place] [id number] [phone]       OFFICE CONSULT NOTE  Mr. Jim EmoryWilliam Payne Date of Birth:  11/08/1940 Medical Record Number:  [id number]   Referring MD:  Hulda HumphreyLiliana Pantea , [facility]  Reason for Referral:  numbness HPI: Mr Jim Payne is a 77 year old pleasant Caucasian male seen today for initial office consultation visit for stroke related numbness.  History is obtained from the patient and review of provided copy of electronic medical records from [place] [facility] [place].  No imaging films are available for review personally.  Patient apparently developed sudden onset of right-sided weakness and numbness and had a CT scan of the brain done on 11/23/2016 which showed left pontine hypodensity consistent with a lacunar infarct.  There is also remote age bilateral basal ganglia lacunar infarcts noted.  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  The patient has been started on Plavix which he states he is taking regularly and is tolerating well.  Is also on Lipitor which is tolerating well.  He states he had lipid profile checked in September this year at the [facility] and was satisfactory.  His blood pressure today is 131/86.  He states he is cut back smoking to half a pack per day but cannot quit.  He has persistent numbness in his right upper extremity which he now states he is gotten used to.  He is just wondering when this will go away completely.  He has regained strength in his arms and hands and can perform all activities of daily living.  He has had no recurrent stroke or TIA symptoms.  ROS:   14 system review of systems is positive for numbness in the right upper extremity only and all other systems negative  PMH:  Past Medical History:  Diagnosis Date  . Stroke [facility](H[facility])      Social History:  Social History   Socioeconomic History  . Marital status: Single    Spouse name: Not on file  . Number of children: Not on file  . Years of education: Not on file  . Highest education level: Not on file  Occupational History  . Not on file  Social Needs  . Financial resource strain: Not on file  . Food insecurity:    Worry: Not on file    Inability: Not on file  . Transportation needs:    Medical: Not on file    Non-medical: Not on file  Tobacco Use  . Smoking status: Current Every Day Smoker  . Smokeless tobacco: Never Used  . Tobacco comment: 1 pk last 3 days.  Substance and Sexual Activity  . Alcohol use: Yes  . Drug use: Yes    Types: Marijuana    Comment: occasionally  . Sexual activity: Not on file  Lifestyle  . Physical activity:    Days per week: Not on file    Minutes per session: Not on file  . Stress: Not on file  Relationships  . Social connections:    Talks on phone: Not on file    Gets together: Not on file    Attends religious service: Not on file    Active member of club or organization: Not on file    Attends meetings of clubs or organizations: Not on  file    Relationship status: Not on file  . Intimate partner violence:    Fear of current or ex partner: Not on file    Emotionally abused: Not on file    Physically abused: Not on file    Forced sexual activity: Not on file  Other Topics Concern  . Not on file  Social History Narrative  . Not on file    Medications:   Current Outpatient Medications on File Prior to Visit  Medication Sig Dispense Refill  . atorvastatin (LIPITOR) 80 MG tablet Take 1 tablet (80 mg total) by mouth daily at 6 PM. 30 tablet 0  . clopidogrel (PLAVIX) 75 MG tablet Take 1 tablet (75 mg total) by mouth daily. 30 tablet 6   No current facility-administered medications on file prior to visit.     Allergies:  No Known Allergies  Physical Exam General: well developed, well nourished elderly  Caucasian male, seated, in no evident distress Head: head normocephalic and atraumatic.   Neck: supple with no carotid or supraclavicular bruits Cardiovascular: regular rate and rhythm, no murmurs Musculoskeletal: no deformity Skin:  no rash/petichiae Vascular:  Normal pulses all extremities  Neurologic Exam Mental Status: Awake and fully alert. Oriented to place and time. Recent and remote memory intact. Attention span, concentration and fund of knowledge appropriate. Mood and affect appropriate.  Cranial Nerves: Fundoscopic exam reveals sharp disc margins. Pupils equal, briskly reactive to light. Extraocular movements full without nystagmus. Visual fields full to confrontation. Hearing intact. Facial sensation intact. Face, tongue, palate moves normally and symmetrically.  Motor: Normal bulk and tone. Normal strength in all tested extremity muscles. Sensory.: intact to touch , pinprick , position and vibratory sensation.  Coordination: Rapid alternating movements normal in all extremities. Finger-to-nose and heel-to-shin performed accurately bilaterally. Gait and Station: Arises from chair without difficulty. Stance is normal. Gait demonstrates normal stride length and balance . Able to heel, toe and tandem walk without difficulty.  Reflexes: 1+ and symmetric. Toes downgoing.   NIHSS  0 Modified Rankin  2   ASSESSMENT: 77 year old Caucasian male with residual post stroke right upper extremity paresthesias following remote left Pontine lacunar infarct.  Vascular risk factors of smoking,   and hyperlipidemia.     PLAN: I had a long d/w patient about his remote thalamic lacunar stroke, risk for recurrent stroke/TIAs, personally independently reviewed imaging studies and stroke evaluation results and answered questions.Continue Plavix 75 mg daily  for secondary stroke prevention and maintain strict control of hypertension with blood pressure goal below 130/90, diabetes with hemoglobin A1c  goal below 6.5% and lipids with LDL cholesterol goal below 70 mg/dL. I also advised the patient to quit smoking cigarettes and using cocaine completely.  Check follow-up lipid profile, hemoglobin A1c and carotid Doppler studies.  He has mild post stroke dysesthesias which she has gotten used to and are not severe enough at the present time to justify a trial of medications.  Greater than 50% time during this 45-minute visit was spent on counseling and coordination of care about his lacunar stroke and discussion about stroke prevention and treatment and answering questions.  No schedule routine followup in the future with me is necessary and he was advised to [facility] [place].  He can be referred back in the future as needed only. Delia Heady, MD  [facility] [address] [place], [place] [address]  Phone [phone] Fax [phone] Note: This document was prepared with  digital dictation and possible smart Company secretary. Any transcriptional errors that result from this process are unintentional.

## 2018-02-26 NOTE — Patient Instructions (Signed)
I had a long d/w patient about his remote thalamic lacunar stroke, risk for recurrent stroke/TIAs, personally independently reviewed imaging studies and stroke evaluation results and answered questions.Continue Plavix 75 mg daily  for secondary stroke prevention and maintain strict control of hypertension with blood pressure goal below 130/90, diabetes with hemoglobin A1c goal below 6.5% and lipids with LDL cholesterol goal below 70 mg/dL. I also advised the patient to quit smoking cigarettes and using cocaine completely.  Check follow-up lipid profile, hemoglobin A1c and carotid Doppler studies.  He has mild post stroke dysesthesias which she has gotten used to and are not severe enough at the present time to justify a trial of medications.  No schedule routine followup in the future with me is necessary and he was advised to continue to follow-up at the veterans clinic in [place].  He can be referred back in the future as needed only.  Stroke Prevention Some medical conditions and behaviors are associated with a higher chance of having a stroke. You can help prevent a stroke by making nutrition, lifestyle, and other changes, including managing any medical conditions you may have. What nutrition changes can be made?  Eat healthy foods. You can do this by: ? Choosing foods high in fiber, such as fresh fruits and vegetables and whole grains. ? Eating at least 5 or more servings of fruits and vegetables a day. Try to fill half of your plate at each meal with fruits and vegetables. ? Choosing lean protein foods, such as lean cuts of meat, poultry without skin, fish, tofu, beans, and nuts. ? Eating low-fat dairy products. ? Avoiding foods that are high in salt (sodium). This can help lower blood pressure. ? Avoiding foods that have saturated fat, trans fat, and cholesterol. This can help prevent high cholesterol. ? Avoiding processed and premade foods.  Follow your health care provider's specific  guidelines for losing weight, controlling high blood pressure (hypertension), lowering high cholesterol, and managing diabetes. These may include: ? Reducing your daily calorie intake. ? Limiting your daily sodium intake to 1,500 milligrams (mg). ? Using only healthy fats for cooking, such as olive oil, canola oil, or sunflower oil. ? Counting your daily carbohydrate intake. What lifestyle changes can be made?  Maintain a healthy weight. Talk to your health care provider about your ideal weight.  Get at least 30 minutes of moderate physical activity at least 5 days a week. Moderate activity includes brisk walking, biking, and swimming.  Do not use any products that contain nicotine or tobacco, such as cigarettes and e-cigarettes. If you need help quitting, ask your health care provider. It may also be helpful to avoid exposure to secondhand smoke.  Limit alcohol intake to no more than 1 drink a day for nonpregnant women and 2 drinks a day for men. One drink equals 12 oz of beer, 5 oz of wine, or 1 oz of hard liquor.  Stop any illegal drug use.  Avoid taking birth control pills. Talk to your health care provider about the risks of taking birth control pills if: ? You are over 7 years old. ? You smoke. ? You get migraines. ? You have ever had a blood clot. What other changes can be made?  Manage your cholesterol levels. ? Eating a healthy diet is important for preventing high cholesterol. If cholesterol cannot be managed through diet alone, you may also need to take medicines. ? Take any prescribed medicines to control your cholesterol as told by your health care  provider.  Manage your diabetes. ? Eating a healthy diet and exercising regularly are important parts of managing your blood sugar. If your blood sugar cannot be managed through diet and exercise, you may need to take medicines. ? Take any prescribed medicines to control your diabetes as told by your health care  provider.  Control your hypertension. ? To reduce your risk of stroke, try to keep your blood pressure below 130/80. ? Eating a healthy diet and exercising regularly are an important part of controlling your blood pressure. If your blood pressure cannot be managed through diet and exercise, you may need to take medicines. ? Take any prescribed medicines to control hypertension as told by your health care provider. ? Ask your health care provider if you should monitor your blood pressure at home. ? Have your blood pressure checked every year, even if your blood pressure is normal. Blood pressure increases with age and some medical conditions.  Get evaluated for sleep disorders (sleep apnea). Talk to your health care provider about getting a sleep evaluation if you snore a lot or have excessive sleepiness.  Take over-the-counter and prescription medicines only as told by your health care provider. Aspirin or blood thinners (antiplatelets or anticoagulants) may be recommended to reduce your risk of forming blood clots that can lead to stroke.  Make sure that any other medical conditions you have, such as atrial fibrillation or atherosclerosis, are managed. What are the warning signs of a stroke? The warning signs of a stroke can be easily remembered as BEFAST.  B is for balance. Signs include: ? Dizziness. ? Loss of balance or coordination. ? Sudden trouble walking.  E is for eyes. Signs include: ? A sudden change in vision. ? Trouble seeing.  F is for face. Signs include: ? Sudden weakness or numbness of the face. ? The face or eyelid drooping to one side.  A is for arms. Signs include: ? Sudden weakness or numbness of the arm, usually on one side of the body.  S is for speech. Signs include: ? Trouble speaking (aphasia). ? Trouble understanding.  T is for time. ? These symptoms may represent a serious problem that is an emergency. Do not wait to see if the symptoms will go away.  Get medical help right away. Call your local emergency services (911 in the [place] Do not drive yourself to the hospital.  Other signs of stroke may include: ? A sudden, severe headache with no known cause. ? Nausea or vomiting. ? Seizure.  Where to find more information: For more information, visit:  American Stroke Association: www.strokeassociation.org  National Stroke Association: www.stroke.org  Summary  You can prevent a stroke by eating healthy, exercising, not smoking, limiting alcohol intake, and managing any medical conditions you may have.  Do not use any products that contain nicotine or tobacco, such as cigarettes and e-cigarettes. If you need help quitting, ask your health care provider. It may also be helpful to avoid exposure to secondhand smoke.  Remember BEFAST for warning signs of stroke. Get help right away if you or a loved one has any of these signs. This information is not intended to replace advice given to you by your health care provider. Make sure you discuss any questions you have with your health care provider. Document Released: 04/21/2004 Document Revised: 04/19/2016 Document Reviewed: 04/19/2016 Elsevier Interactive Patient Education  [employer]2018 [employer]

## 2018-06-18 ENCOUNTER — Telehealth: Payer: Self-pay | Admitting: Neurology

## 2018-10-02 ENCOUNTER — Emergency Department (HOSPITAL_COMMUNITY): Payer: No Typology Code available for payment source

## 2018-10-02 ENCOUNTER — Other Ambulatory Visit: Payer: Self-pay

## 2018-10-02 ENCOUNTER — Emergency Department (HOSPITAL_COMMUNITY)
Admission: EM | Admit: 2018-10-02 | Discharge: 2018-10-02 | Disposition: A | Discharge status: Home / Self Care | Payer: No Typology Code available for payment source | Attending: Emergency Medicine | Admitting: Emergency Medicine

## 2018-10-02 ENCOUNTER — Encounter (HOSPITAL_COMMUNITY): Payer: Self-pay

## 2018-10-02 DIAGNOSIS — S299XXA Unspecified injury of thorax, initial encounter: Secondary | ICD-10-CM | POA: Diagnosis present

## 2018-10-02 DIAGNOSIS — S2232XA Fracture of one rib, left side, initial encounter for closed fracture: Secondary | ICD-10-CM | POA: Diagnosis not present

## 2018-10-02 DIAGNOSIS — F17200 Nicotine dependence, unspecified, uncomplicated: Secondary | ICD-10-CM | POA: Insufficient documentation

## 2018-10-02 DIAGNOSIS — Y999 Unspecified external cause status: Secondary | ICD-10-CM | POA: Diagnosis not present

## 2018-10-02 DIAGNOSIS — W19XXXA Unspecified fall, initial encounter: Secondary | ICD-10-CM

## 2018-10-02 DIAGNOSIS — Z7902 Long term (current) use of antithrombotics/antiplatelets: Secondary | ICD-10-CM | POA: Insufficient documentation

## 2018-10-02 DIAGNOSIS — Z79899 Other long term (current) drug therapy: Secondary | ICD-10-CM | POA: Insufficient documentation

## 2018-10-02 DIAGNOSIS — Y929 Unspecified place or not applicable: Secondary | ICD-10-CM | POA: Diagnosis not present

## 2018-10-02 DIAGNOSIS — W010XXA Fall on same level from slipping, tripping and stumbling without subsequent striking against object, initial encounter: Secondary | ICD-10-CM | POA: Insufficient documentation

## 2018-10-02 DIAGNOSIS — Y939 Activity, unspecified: Secondary | ICD-10-CM | POA: Diagnosis not present

## 2018-10-02 MED ORDER — LIDOCAINE 5 % EX PTCH: 1.0000 | MEDICATED_PATCH | CUTANEOUS | 0 refills | Status: AC

## 2018-10-02 MED ORDER — ACETAMINOPHEN 500 MG PO TABS
1000.0000 mg | ORAL_TABLET | Freq: Once | ORAL | Status: AC
  Administered 2018-10-02: 1000 mg via ORAL
  Filled 2018-10-02: qty 2

## 2018-10-02 MED ORDER — TRAMADOL HCL 50 MG PO TABS: 50.0000 mg | ORAL_TABLET | Freq: Four times a day (QID) | ORAL | 0 refills | Status: AC | PRN

## 2018-10-02 NOTE — Discharge Instructions (Signed)
Please be sure to follow-up with your primary care physician as needed.  As discussed, you likely have soreness and pain for the next couple days, but use pain medication as prescribed, focus on using the incentive spirometer, and do not hesitate to return if your condition worsens.

## 2018-10-02 NOTE — ED Notes (Signed)
Pt transported back from radiology

## 2018-10-02 NOTE — ED Provider Notes (Signed)
TIME SEEN: 6:02 AM  CHIEF COMPLAINT: Fall, left rib pain  HPI: Patient is a 78 year old male with history of previous stroke on Plavix who presents to the emergency department after he fell 2 days ago.  States he tripped and fell landing on his left side.  He denies any head injury.  Denies neck or back pain.  No abdominal pain.  No numbness or weakness.  No extremity pain.  States he is having pain over his left lateral ribs.  It is causing him to feel like he is having a hard time breathing.  He did not take any medications for pain prior to arrival.  He states he lives at home alone and drove himself to the emergency department.  ROS: See HPI Constitutional: no fever  Eyes: no drainage  ENT: no runny nose   Cardiovascular:  no chest pain  Resp: no SOB  GI: no vomiting GU: no dysuria Integumentary: no rash  Allergy: no hives  Musculoskeletal: no leg swelling  Neurological: no slurred speech ROS otherwise negative  PAST MEDICAL HISTORY/PAST SURGICAL HISTORY:  Past Medical History:  Diagnosis Date  . Stroke [facility](HCC)     MEDICATIONS:  Prior to Admission medications   Medication Sig Start Date End Date Taking? Authorizing Provider  atorvastatin (LIPITOR) 80 MG tablet Take 1 tablet (80 mg total) by mouth daily at 6 PM. 11/25/16   Marguerita MerlesSheikh, Omair Latif, DO  clopidogrel (PLAVIX) 75 MG tablet Take 1 tablet (75 mg total) by mouth daily. 03/29/17   Chuck Hintickson, Christopher S, MD    ALLERGIES:  No Known Allergies  SOCIAL HISTORY:  Social History   Tobacco Use  . Smoking status: Current Every Day Smoker  . Smokeless tobacco: Never Used  . Tobacco comment: 1 pk last 3 days.  Substance Use Topics  . Alcohol use: Yes    FAMILY HISTORY: History reviewed. No pertinent family history.  EXAM: BP (!) 145/91 (BP Location: Right Arm)   Pulse 97   Temp 98.1 F (36.7 C) (Oral)   Resp 18   SpO2 98%  CONSTITUTIONAL: Alert and oriented and responds appropriately to questions.  Really, appears  uncomfortable HEAD: Normocephalic; atraumatic EYES: Conjunctivae clear, PERRL, EOMI ENT: normal nose; no rhinorrhea; moist mucous membranes; pharynx without lesions noted; no dental injury; no septal hematoma NECK: Supple, no meningismus, no LAD; no midline spinal tenderness, step-off or deformity; trachea midline CARD: RRR; S1 and S2 appreciated; no murmurs, no clicks, no rubs, no gallops RESP: Normal chest excursion without splinting or tachypnea; breath sounds clear and equal bilaterally; no wheezes, no rhonchi, no rales; no hypoxia or respiratory distress CHEST:  chest wall stable, no crepitus or ecchymosis or deformity, tender to palpation over the left lateral chest wall, no flail chest ABD/GI: Normal bowel sounds; non-distended; soft, non-tender, no rebound, no guarding; no ecchymosis or other lesions noted PELVIS:  stable, nontender to palpation BACK:  The back appears normal and is non-tender to palpation, there is no CVA tenderness; no midline spinal tenderness, step-off or deformity EXT: Normal ROM in all joints; non-tender to palpation; no edema; normal capillary refill; no cyanosis, no bony tenderness or bony deformity of patient's extremities, no joint effusion, compartments are soft, extremities are warm and well-perfused, no ecchymosis SKIN: Normal color for age and race; warm NEURO: Moves all extremities equally, reports normal sensation diffusely, able to ambulate slowly but with a steady gait, cranial nerves II through XII intact, normal speech PSYCH: The patient's mood and manner are appropriate. Grooming  and personal hygiene are appropriate.  MEDICAL DECISION MAKING: Patient here after mechanical fall that occurred 2 days ago.  Complaining of left rib pain.  Will obtain x-rays.  Will give Tylenol for pain.  He states he drove himself here and does not have anyone to drive him home.  We will also provide with incentive spirometer as he does appear uncomfortable with deep  inspiration but is not splinting or hypoxic.  No other sign of trauma on exam.  Hemodynamically stable and neurologically intact.  ED PROGRESS: X-ray pending.  Signed out to oncoming ED physician to evaluate patient's imaging.   I reviewed all nursing notes, vitals, pertinent previous records, EKGs, lab and urine results, imaging (as available).      Nathalya Wolanski, Delice Bison, DO 10/02/18 [phone]

## 2018-10-02 NOTE — ED Provider Notes (Signed)
7:50 AM Patient in no distress, resting, seemingly comfortably, though he does have pain. He is also ambulated to the bathroom, without apparent complication. He is aware of findings, likelihood of ongoing pain and soreness for the coming days given x-ray evidence for rib fracture. Patient discharged in stable condition.   Carmin Muskrat, MD 10/02/18 [phone]

## 2018-10-02 NOTE — ED Triage Notes (Signed)
Pt states that he fell 2 days ago and is now really sore and having trouble getting around. He states, "I'm getting old." Pt states that he is sore all over, but the worst pain is at his left ribcage. Reports that it hurts to take a deep breath.

## 2018-10-02 NOTE — ED Notes (Signed)
RN instructed patient in the use of incentive spirometer and had him demonstrate correct use during discharge. Pt is alert and oriented and in no active distress at this time.  RN assisted patient out in a wheelchair.

## 2018-12-16 ENCOUNTER — Emergency Department (HOSPITAL_COMMUNITY)
Admission: EM | Admit: 2018-12-16 | Discharge: 2018-12-16 | Disposition: A | Discharge status: Home / Self Care | Payer: No Typology Code available for payment source | Attending: Emergency Medicine | Admitting: Emergency Medicine

## 2018-12-16 ENCOUNTER — Encounter (HOSPITAL_COMMUNITY): Payer: Self-pay

## 2018-12-16 ENCOUNTER — Emergency Department (HOSPITAL_COMMUNITY): Payer: No Typology Code available for payment source

## 2018-12-16 ENCOUNTER — Other Ambulatory Visit: Payer: Self-pay

## 2018-12-16 DIAGNOSIS — Z20828 Contact with and (suspected) exposure to other viral communicable diseases: Secondary | ICD-10-CM | POA: Insufficient documentation

## 2018-12-16 DIAGNOSIS — I10 Essential (primary) hypertension: Secondary | ICD-10-CM | POA: Diagnosis not present

## 2018-12-16 DIAGNOSIS — F17210 Nicotine dependence, cigarettes, uncomplicated: Secondary | ICD-10-CM | POA: Diagnosis not present

## 2018-12-16 DIAGNOSIS — Z79899 Other long term (current) drug therapy: Secondary | ICD-10-CM | POA: Insufficient documentation

## 2018-12-16 DIAGNOSIS — R0602 Shortness of breath: Secondary | ICD-10-CM | POA: Diagnosis not present

## 2018-12-16 DIAGNOSIS — F1210 Cannabis abuse, uncomplicated: Secondary | ICD-10-CM | POA: Insufficient documentation

## 2018-12-16 DIAGNOSIS — M791 Myalgia, unspecified site: Secondary | ICD-10-CM

## 2018-12-16 DIAGNOSIS — Z7901 Long term (current) use of anticoagulants: Secondary | ICD-10-CM | POA: Diagnosis not present

## 2018-12-16 DIAGNOSIS — Z8673 Personal history of transient ischemic attack (TIA), and cerebral infarction without residual deficits: Secondary | ICD-10-CM | POA: Diagnosis not present

## 2018-12-16 LAB — CBC WITH DIFFERENTIAL/PLATELET
Abs Immature Granulocytes: 0.02 10*3/uL (ref 0.00–0.07)
Basophils Absolute: 0.1 10*3/uL (ref 0.0–0.1)
Basophils Relative: 1 %
Eosinophils Absolute: 0 10*3/uL (ref 0.0–0.5)
Eosinophils Relative: 0 %
HCT: 46.7 % (ref 39.0–52.0)
Hemoglobin: 14.5 g/dL (ref 13.0–17.0)
Immature Granulocytes: 0 %
Lymphocytes Relative: 26 %
Lymphs Abs: 2 10*3/uL (ref 0.7–4.0)
MCH: 27.9 pg (ref 26.0–34.0)
MCHC: 31 g/dL (ref 30.0–36.0)
MCV: 90 fL (ref 80.0–100.0)
Monocytes Absolute: 0.4 10*3/uL (ref 0.1–1.0)
Monocytes Relative: 5 %
Neutro Abs: 5.1 10*3/uL (ref 1.7–7.7)
Neutrophils Relative %: 68 %
Platelets: 194 10*3/uL (ref 150–400)
RBC: 5.19 MIL/uL (ref 4.22–5.81)
RDW: 13.8 % (ref 11.5–15.5)
WBC: 7.6 10*3/uL (ref 4.0–10.5)
nRBC: 0 % (ref 0.0–0.2)

## 2018-12-16 LAB — BASIC METABOLIC PANEL
Anion gap: 7 (ref 5–15)
BUN: 27 mg/dL — ABNORMAL HIGH (ref 8–23)
CO2: 28 mmol/L (ref 22–32)
Calcium: 8.9 mg/dL (ref 8.9–10.3)
Chloride: 102 mmol/L (ref 98–111)
Creatinine, Ser: 1.43 mg/dL — ABNORMAL HIGH (ref 0.61–1.24)
GFR calc Af Amer: 55 mL/min — ABNORMAL LOW (ref >60)
GFR calc non Af Amer: 48 mL/min — ABNORMAL LOW (ref >60)
Glucose, Bld: 132 mg/dL — ABNORMAL HIGH (ref 70–99)
Potassium: 4.2 mmol/L (ref 3.5–5.1)
Sodium: 137 mmol/L (ref 135–145)

## 2018-12-16 LAB — TROPONIN I (HIGH SENSITIVITY): Troponin I (High Sensitivity): 8 ng/L (ref <18)

## 2018-12-16 MED ORDER — SODIUM CHLORIDE 0.9 % IV BOLUS
1000.0000 mL | Freq: Once | INTRAVENOUS | Status: AC
  Administered 2018-12-16: 1000 mL via INTRAVENOUS

## 2018-12-16 NOTE — Discharge Instructions (Addendum)
Your COVID test result will likely be ready in the next 24 hours.  Please call back into the ER to ask for your results.  In the meantime, isolate yourself and try to avoid contact with other people until you know your results.

## 2018-12-16 NOTE — ED Notes (Signed)
Pt given coke at this time.  

## 2018-12-16 NOTE — ED Triage Notes (Signed)
Pt states that he has had SHOB x 1 week. Pt states he has been unable to get around like normal due to being [facility]. Pt states this feels different than his rib pain. Pt unsure if he has had any COVID exposure. Pt is living alone in hotel currently. NAD

## 2018-12-16 NOTE — ED Notes (Signed)
Patient was able to stand up from wheelchair without assistance and sit on bed. Patient was able to left both legs and put them on ED stretcher without assistance.

## 2018-12-16 NOTE — ED Notes (Signed)
He is thin, but healthy in appearance. He does have a rather flat affect.

## 2018-12-16 NOTE — ED Notes (Signed)
Pt states that he is actually no longer in a hotel and is homeless.

## 2018-12-16 NOTE — ED Notes (Signed)
He is not happy to be leaving--he states "something is wrong I might have that virus". I explain to him that, even if he does have SARS II, if he is not hypoxemic, neither he, nor anyone in that condition would be admitted.

## 2018-12-16 NOTE — ED Notes (Signed)
Patient transported to X-ray 

## 2018-12-16 NOTE — ED Provider Notes (Signed)
[facility]-EMERGENCY DEPT Provider Note   CSN: [id number] Arrival date & time: 12/16/18  1237     History   Chief Complaint Chief Complaint  Patient presents with  . Shortness of Breath    HPI Jim Payne is a 78 y.o. male past medical history of homelessness, COPD, stroke, presented to emergency department complaining of weakness and shortness of breath.  He reports symptoms ongoing for approximately 1 week.  Describes general malaise, worsening shortness of breath, worsening dyspnea on exertion, nausea and diminished appetite.  He is unable to provide any other information other than to keep repeating "something just does not feel right".  He reports he smokes about a third a pack of cigarettes per day and has been smoking for over 60 years.  He does not have any medications with him.  He reports he is living in a hotel room. He is not staying at a shelter.  He denies any fevers or chills.  He reports he has a chronic productive cough.  He denies any diarrhea.  He denies any chest pain or pressure with the symptoms.  He denies any near syncope.     HPI  Past Medical History:  Diagnosis Date  . Stroke [facility](HCC)     Patient Active Problem List   Diagnosis Date Noted  . Essential hypertension 02/21/2017  . Mild tetrahydrocannabinol (THC) abuse 02/21/2017  . Cerebrovascular accident (CVA) due to thrombosis of left posterior cerebral artery (HCC) 02/21/2017  . Numbness   . Cocaine abuse (HCC)   . Smoker   . Hyperlipidemia   . Stroke [facility](HCC) 11/23/2016    History reviewed. No pertinent surgical history.      Home Medications    Prior to Admission medications   Medication Sig Start Date End Date Taking? Authorizing Provider  atorvastatin (LIPITOR) 80 MG tablet Take 1 tablet (80 mg total) by mouth daily at 6 PM. Patient not taking: Reported on 12/16/2018 11/25/16   Marguerita MerlesSheikh, Omair Latif, DO  clopidogrel (PLAVIX) 75 MG tablet Take 1 tablet (75 mg total) by  mouth daily. Patient not taking: Reported on 12/16/2018 03/29/17   Chuck Hintickson, Christopher S, MD  lidocaine (LIDODERM) 5 % Place 1 patch onto the skin daily. Remove & Discard patch within 12 hours or as directed by MD Patient not taking: Reported on 12/16/2018 10/02/18   Gerhard MunchLockwood, Robert, MD    Family History No family history on file.  Social History Social History   Tobacco Use  . Smoking status: Current Every Day Smoker  . Smokeless tobacco: Never Used  . Tobacco comment: 1 pk last 3 days.  Substance Use Topics  . Alcohol use: Yes  . Drug use: Yes    Types: Marijuana    Comment: occasionally     Allergies   Patient has no known allergies.   Review of Systems Review of Systems  Constitutional: Positive for appetite change. Negative for chills and fever.  Eyes: Negative for photophobia and visual disturbance.  Respiratory: Positive for cough and shortness of breath.   Cardiovascular: Negative for chest pain and palpitations.  Gastrointestinal: Positive for nausea. Negative for abdominal pain and vomiting.  Musculoskeletal: Positive for arthralgias, back pain and myalgias. Negative for neck pain.  Neurological: Negative for seizures, syncope, numbness and headaches.  All other systems reviewed and are negative.    Physical Exam Updated Vital Signs BP (!) 159/106   Pulse 71   Temp 98.5 F (36.9 C) (Oral)   Resp 17  Wt 73 kg   SpO2 97%   BMI 21.23 kg/m   Physical Exam Vitals signs and nursing note reviewed.  Constitutional:      Comments: Thin, appears tired  HENT:     Head: Normocephalic and atraumatic.  Eyes:     Conjunctiva/sclera: Conjunctivae normal.  Neck:     Musculoskeletal: Neck supple.  Cardiovascular:     Rate and Rhythm: Regular rhythm. Tachycardia present.     Heart sounds: No murmur.     Comments: HR 101 Pulmonary:     Effort: Pulmonary effort is normal. No accessory muscle usage or respiratory distress.     Breath sounds: Examination of the  right-lower field reveals rales. Rales present.     Comments: Speaking comfortably 96% on room air Abdominal:     Palpations: Abdomen is soft.     Tenderness: There is no abdominal tenderness.  Skin:    General: Skin is warm and dry.  Neurological:     General: No focal deficit present.     Mental Status: He is alert and oriented to person, place, and time.      ED Treatments / Results  Labs (all labs ordered are listed, but only abnormal results are displayed) Labs Reviewed  BASIC METABOLIC PANEL - Abnormal; Notable for the following components:      Result Value   Glucose, Bld 132 (*)    BUN 27 (*)    Creatinine, Ser 1.43 (*)    GFR calc non Af Amer 48 (*)    GFR calc Af Amer 55 (*)    All other components within normal limits  SARS CORONAVIRUS 2 (TAT 6-24 HRS)  CBC WITH DIFFERENTIAL/PLATELET  TROPONIN I (HIGH SENSITIVITY)  TROPONIN I (HIGH SENSITIVITY)    EKG EKG Interpretation  Date/Time:  Sunday December 16 2018 12:47:11 EDT Ventricular Rate:  92 PR Interval:    QRS Duration: 110 QT Interval:  335 QTC Calculation: 415 R Axis:   95 Text Interpretation:  Sinus tachycardia Atrial premature complexes Biatrial enlargement Left posterior fascicular block No STEMI Confirmed by Octaviano Glow [id number]) on 12/16/2018 1:28:48 PM   Radiology Dg Chest 2 View  Result Date: 12/16/2018 CLINICAL DATA:  Shortness of breath for a week. EXAM: CHEST - 2 VIEW COMPARISON:  Chest x-rays dated 10/02/2018 and 11/23/2016. FINDINGS: Heart size and mediastinal contours are stable. Lungs are hyperexpanded. Chronic bronchitic changes centrally. Lungs are otherwise clear. No pleural effusion or pneumothorax is seen. No acute or suspicious osseous finding. IMPRESSION: 1. No active cardiopulmonary disease. No evidence of pneumonia or pulmonary edema. 2. Hyperexpanded lungs indicating COPD.  Chronic bronchitic changes. Electronically Signed   By: Franki Cabot M.D.   On: 12/16/2018 13:32     Procedures Procedures (including critical care time)  Medications Ordered in ED Medications  sodium chloride 0.9 % bolus 1,000 mL (0 mLs Intravenous Stopped 12/16/18 1434)     Initial Impression / Assessment and Plan / ED Course  I have reviewed the triage vital signs and the nursing notes.  Pertinent labs & imaging results that were available during my care of the patient were reviewed by me and considered in my medical decision making (see chart for details).  78 year old gentleman with history as noted above, likely COPD, presented to emerge department worsening shortness of breath for approximately 1 week.  Describes multiple other symptoms consistent with a viral syndrome, including malaise and diminished appetite.  On physical exam he is thin but otherwise demonstrating no evidence of  acute respiratory distress.  He does not need supplemental oxygen.  Overall my suspicion for ACS is relatively low.  His symptoms are not exertional and he has no chest pain. However I believe it is reasonable to check an EKG and troponin level while he is here.  For SOB, will obtain a chest x-ray and COVID testing.  Given his poor nutritional status, I will also check his hemoglobin level and his electrolyte levels.  He does appear somewhat dehydrated my exam.  We will give him a liter of fluids.  I have a low suspicion for sepsis based on his presentation  Han Weninger was evaluated in Emergency Department on 12/16/2018 for the symptoms described in the history of present illness. He was evaluated in the context of the global COVID-19 pandemic, which necessitated consideration that the patient might be at risk for infection with the SARS-CoV-2 virus that causes COVID-19. Institutional protocols and algorithms that pertain to the evaluation of patients at risk for COVID-19 are in a state of rapid change based on information released by regulatory bodies including the CDC and federal and state  organizations. These policies and algorithms were followed during the patient's care in the ED.  Clinical Course as of Dec 15 2321  Sun Dec 16, 2018  1335 IMPRESSION: 1. No active cardiopulmonary disease. No evidence of pneumonia or pulmonary edema. 2. Hyperexpanded lungs indicating COPD. Chronic bronchitic changes.      [MT]  1455 Initial trope of 8 after a week of symptoms, I have a lower suspicion for an STEMI or acute coronary syndrome at this time.   [MT]  1604 Pulse rate documented in the 130s which was erroneously recorded off of the pulse ox, when I went back in the room the patient's heart rate was in fact 80.   [MT]  1605 I discussed with the patient that he appears to have some mild dehydration and a mild kidney injury, which is why we give him some IV fluids.  We discussed drinking water.  I explained to him that his COVID test results will likely result in 24 hours.  I advised him to call back into the ER for his results   [MT]    Clinical Course User Index [MT] Victory Dresden, Kermit Balo, MD     ED Discharge Orders    None       Terald Sleeper, MD 12/16/18 2325

## 2018-12-17 LAB — SARS CORONAVIRUS 2 (TAT 6-24 HRS): SARS Coronavirus 2: NEGATIVE

## 2018-12-26 ENCOUNTER — Encounter (HOSPITAL_COMMUNITY): Payer: Self-pay | Admitting: Family Medicine

## 2018-12-26 ENCOUNTER — Emergency Department (HOSPITAL_COMMUNITY): Payer: No Typology Code available for payment source

## 2018-12-26 ENCOUNTER — Other Ambulatory Visit: Payer: Self-pay

## 2018-12-26 ENCOUNTER — Emergency Department (HOSPITAL_COMMUNITY)
Admission: EM | Admit: 2018-12-26 | Discharge: 2018-12-26 | Disposition: A | Discharge status: Home / Self Care | Payer: No Typology Code available for payment source | Attending: Emergency Medicine | Admitting: Emergency Medicine

## 2018-12-26 DIAGNOSIS — R0602 Shortness of breath: Secondary | ICD-10-CM | POA: Diagnosis present

## 2018-12-26 DIAGNOSIS — Z8673 Personal history of transient ischemic attack (TIA), and cerebral infarction without residual deficits: Secondary | ICD-10-CM | POA: Diagnosis not present

## 2018-12-26 DIAGNOSIS — B349 Viral infection, unspecified: Secondary | ICD-10-CM | POA: Insufficient documentation

## 2018-12-26 DIAGNOSIS — I10 Essential (primary) hypertension: Secondary | ICD-10-CM | POA: Diagnosis not present

## 2018-12-26 DIAGNOSIS — F17200 Nicotine dependence, unspecified, uncomplicated: Secondary | ICD-10-CM | POA: Diagnosis not present

## 2018-12-26 DIAGNOSIS — Z20828 Contact with and (suspected) exposure to other viral communicable diseases: Secondary | ICD-10-CM | POA: Diagnosis not present

## 2018-12-26 DIAGNOSIS — Z7901 Long term (current) use of anticoagulants: Secondary | ICD-10-CM | POA: Diagnosis not present

## 2018-12-26 DIAGNOSIS — Z79899 Other long term (current) drug therapy: Secondary | ICD-10-CM | POA: Insufficient documentation

## 2018-12-26 LAB — COMPREHENSIVE METABOLIC PANEL
ALT: 12 U/L (ref 0–44)
AST: 19 U/L (ref 15–41)
Albumin: 4.4 g/dL (ref 3.5–5.0)
Alkaline Phosphatase: 77 U/L (ref 38–126)
Anion gap: 10 (ref 5–15)
BUN: 17 mg/dL (ref 8–23)
CO2: 29 mmol/L (ref 22–32)
Calcium: 9.3 mg/dL (ref 8.9–10.3)
Chloride: 101 mmol/L (ref 98–111)
Creatinine, Ser: 1.19 mg/dL (ref 0.61–1.24)
GFR calc Af Amer: >60 mL/min (ref >60)
GFR calc non Af Amer: 59 mL/min — ABNORMAL LOW (ref >60)
Glucose, Bld: 91 mg/dL (ref 70–99)
Potassium: 4.7 mmol/L (ref 3.5–5.1)
Sodium: 140 mmol/L (ref 135–145)
Total Bilirubin: 0.8 mg/dL (ref 0.3–1.2)
Total Protein: 7.5 g/dL (ref 6.5–8.1)

## 2018-12-26 LAB — CBC WITH DIFFERENTIAL/PLATELET
Abs Immature Granulocytes: 0.02 10*3/uL (ref 0.00–0.07)
Basophils Absolute: 0.1 10*3/uL (ref 0.0–0.1)
Basophils Relative: 1 %
Eosinophils Absolute: 0 10*3/uL (ref 0.0–0.5)
Eosinophils Relative: 0 %
HCT: 49 % (ref 39.0–52.0)
Hemoglobin: 15.5 g/dL (ref 13.0–17.0)
Immature Granulocytes: 0 %
Lymphocytes Relative: 25 %
Lymphs Abs: 1.9 10*3/uL (ref 0.7–4.0)
MCH: 28.2 pg (ref 26.0–34.0)
MCHC: 31.6 g/dL (ref 30.0–36.0)
MCV: 89.1 fL (ref 80.0–100.0)
Monocytes Absolute: 0.4 10*3/uL (ref 0.1–1.0)
Monocytes Relative: 5 %
Neutro Abs: 5.3 10*3/uL (ref 1.7–7.7)
Neutrophils Relative %: 69 %
Platelets: 234 10*3/uL (ref 150–400)
RBC: 5.5 MIL/uL (ref 4.22–5.81)
RDW: 13.8 % (ref 11.5–15.5)
WBC: 7.7 10*3/uL (ref 4.0–10.5)
nRBC: 0 % (ref 0.0–0.2)

## 2018-12-26 LAB — TROPONIN I (HIGH SENSITIVITY): Troponin I (High Sensitivity): 8 ng/L (ref <18)

## 2018-12-26 LAB — BRAIN NATRIURETIC PEPTIDE: B Natriuretic Peptide: 71.3 pg/mL (ref 0.0–100.0)

## 2018-12-26 MED ORDER — IOHEXOL 350 MG/ML SOLN
80.0000 mL | Freq: Once | INTRAVENOUS | Status: AC | PRN
  Administered 2018-12-26: 10:00:00 80 mL via INTRAVENOUS

## 2018-12-26 MED ORDER — SODIUM CHLORIDE (PF) 0.9 % IJ SOLN
INTRAMUSCULAR | Status: AC
  Filled 2018-12-26: qty 50

## 2018-12-26 NOTE — Discharge Instructions (Signed)
Your workup here is without pneumonia.  Your CT scan of the chest was also without blood clot.  As discussed before the most likely cause of your symptoms would be a viral illness.  Please try and not be around other people in case your could have the coronavirus.      Person Under Monitoring Name: Jim EmoryWilliam Payne  Location: [address]  [place] [id number]   Infection Prevention Recommendations for Individuals Confirmed to have, or Being Evaluated for, 2019 Novel Coronavirus (COVID-19) Infection Who Receive Care at Home  Individuals who are confirmed to have, or are being evaluated for, COVID-19 should follow the prevention steps below until a healthcare provider or local or state health department says they can return to normal activities.  Stay home except to get medical care You should restrict activities outside your home, except for getting medical care. Do not go to work, school, or public areas, and do not use public transportation or taxis.  Call ahead before visiting your doctor Before your medical appointment, call the healthcare provider and tell them that you have, or are being evaluated for, COVID-19 infection. This will help the healthcare providers office take steps to keep other people from getting infected. Ask your healthcare provider to call the local or state health department.  Monitor your symptoms Seek prompt medical attention if your illness is worsening (e.g., difficulty breathing). Before going to your medical appointment, call the healthcare provider and tell them that you have, or are being evaluated for, COVID-19 infection. Ask your healthcare provider to call the local or state health department.  Wear a facemask You should wear a facemask that covers your nose and mouth when you are in the same room with other people and when you visit a healthcare provider. People who live with or visit you should also wear a facemask while they are in the same  room with you.  Separate yourself from other people in your home As much as possible, you should stay in a different room from other people in your home. Also, you should use a separate bathroom, if available.  Avoid sharing household items You should not share dishes, drinking glasses, cups, eating utensils, towels, bedding, or other items with other people in your home. After using these items, you should wash them thoroughly with soap and water.  Cover your coughs and sneezes Cover your mouth and nose with a tissue when you cough or sneeze, or you can cough or sneeze into your sleeve. Throw used tissues in a lined trash can, and immediately wash your hands with soap and water for at least 20 seconds or use an alcohol-based hand rub.  Wash your [employer] Corporationhands Wash your hands often and thoroughly with soap and water for at least 20 seconds. You can use an alcohol-based hand sanitizer if soap and water are not available and if your hands are not visibly dirty. Avoid touching your eyes, nose, and mouth with unwashed hands.   Prevention Steps for Caregivers and Household Members of Individuals Confirmed to have, or Being Evaluated for, COVID-19 Infection Being Cared for in the Home  If you live with, or provide care at home for, a person confirmed to have, or being evaluated for, COVID-19 infection please follow these guidelines to prevent infection:  Follow healthcare providers instructions Make sure that you understand and can help the patient follow any healthcare provider instructions for all care.  Provide for the patients basic needs You should help the patient with  basic needs in the home and provide support for getting groceries, prescriptions, and other personal needs.  Monitor the patients symptoms If they are getting sicker, call his or her medical provider and tell them that the patient has, or is being evaluated for, COVID-19 infection. This will help the healthcare  providers office take steps to keep other people from getting infected. Ask the healthcare provider to call the local or state health department.  Limit the number of people who have contact with the patient If possible, have only one caregiver for the patient. Other household members should stay in another home or place of residence. If this is not possible, they should stay in another room, or be separated from the patient as much as possible. Use a separate bathroom, if available. Restrict visitors who do not have an essential need to be in the home.  Keep older adults, very young children, and other sick people away from the patient Keep older adults, very young children, and those who have compromised immune systems or chronic health conditions away from the patient. This includes people with chronic heart, lung, or kidney conditions, diabetes, and cancer.  Ensure good ventilation Make sure that shared spaces in the home have good air flow, such as from an air conditioner or an opened window, weather permitting.  Wash your hands often Wash your hands often and thoroughly with soap and water for at least 20 seconds. You can use an alcohol based hand sanitizer if soap and water are not available and if your hands are not visibly dirty. Avoid touching your eyes, nose, and mouth with unwashed hands. Use disposable paper towels to dry your hands. If not available, use dedicated cloth towels and replace them when they become wet.  Wear a facemask and gloves Wear a disposable facemask at all times in the room and gloves when you touch or have contact with the patients blood, body fluids, and/or secretions or excretions, such as sweat, saliva, sputum, nasal mucus, vomit, urine, or feces.  Ensure the mask fits over your nose and mouth tightly, and do not touch it during use. Throw out disposable facemasks and gloves after using them. Do not reuse. Wash your hands immediately after removing your  facemask and gloves. If your personal clothing becomes contaminated, carefully remove clothing and launder. Wash your hands after handling contaminated clothing. Place all used disposable facemasks, gloves, and other waste in a lined container before disposing them with other household waste. Remove gloves and wash your hands immediately after handling these items.  Do not share dishes, glasses, or other household items with the patient Avoid sharing household items. You should not share dishes, drinking glasses, cups, eating utensils, towels, bedding, or other items with a patient who is confirmed to have, or being evaluated for, COVID-19 infection. After the person uses these items, you should wash them thoroughly with soap and water.  Wash laundry thoroughly Immediately remove and wash clothes or bedding that have blood, body fluids, and/or secretions or excretions, such as sweat, saliva, sputum, nasal mucus, vomit, urine, or feces, on them. Wear gloves when handling laundry from the patient. Read and follow directions on labels of laundry or clothing items and detergent. In general, wash and dry with the warmest temperatures recommended on the label.  Clean all areas the individual has used often Clean all touchable surfaces, such as counters, tabletops, doorknobs, bathroom fixtures, toilets, phones, keyboards, tablets, and bedside tables, every day. Also, clean any surfaces that may have  blood, body fluids, and/or secretions or excretions on them. Wear gloves when cleaning surfaces the patient has come in contact with. Use a diluted bleach solution (e.g., dilute bleach with 1 part bleach and 10 parts water) or a household disinfectant with a label that says EPA-registered for coronaviruses. To make a bleach solution at home, add 1 tablespoon of bleach to 1 quart (4 cups) of water. For a larger supply, add  cup of bleach to 1 gallon (16 cups) of water. Read labels of cleaning products and  follow recommendations provided on product labels. Labels contain instructions for safe and effective use of the cleaning product including precautions you should take when applying the product, such as wearing gloves or eye protection and making sure you have good ventilation during use of the product. Remove gloves and wash hands immediately after cleaning.  Monitor yourself for signs and symptoms of illness Caregivers and household members are considered close contacts, should monitor their health, and will be asked to limit movement outside of the home to the extent possible. Follow the monitoring steps for close contacts listed on the symptom monitoring form.   ? If you have additional questions, contact your local health department or call the epidemiologist on call at [phone] (available 24/7). ? This guidance is subject to change. For the most up-to-date guidance from [facility], please refer to their website: YouBlogs.pl

## 2018-12-26 NOTE — ED Notes (Signed)
Pt verbalized understanding if DC instructions. Belongings returned. Pt ambulatory out of ED

## 2018-12-26 NOTE — ED Provider Notes (Signed)
[facility]-EMERGENCY DEPT Provider Note   CSN: [id number] Arrival date & time: 12/26/18  3419     History   Chief Complaint Chief Complaint  Patient presents with   Shortness of Breath   Weakness    HPI Jim Payne is a 78 y.o. male.     78 yo M with a cc of cough, sob and crusting to his nose.  Has been going on for about 10 days now.  Patient has not had any significant improvement.  Was seen in the ED and thought to be viral and discharged home.  Patient with continued symptoms.  He does continue to smoke.  No chest pain no fever.  No abdominal pain vomiting or diarrhea.  No known sick contacts.  Patient has not seen his PCP for this.  States has had trouble getting into the [place].  The history is provided by the patient.  Shortness of Breath Severity:  Moderate Onset quality:  Gradual Duration:  10 days Timing:  Constant Progression:  Worsening Chronicity:  New Relieved by:  Nothing Worsened by:  Nothing Ineffective treatments:  None tried Associated symptoms: cough   Associated symptoms: no abdominal pain, no chest pain, no fever, no headaches, no rash and no vomiting   Weakness Associated symptoms: cough and shortness of breath   Associated symptoms: no abdominal pain, no arthralgias, no chest pain, no diarrhea, no fever, no headaches, no myalgias and no vomiting     Past Medical History:  Diagnosis Date   Stroke [facility])     Patient Active Problem List   Diagnosis Date Noted   Essential hypertension 02/21/2017   Mild tetrahydrocannabinol (THC) abuse 02/21/2017   Cerebrovascular accident (CVA) due to thrombosis of left posterior cerebral artery (HCC) 02/21/2017   Numbness    Cocaine abuse (HCC)    Smoker    Hyperlipidemia    Stroke (HCC) 11/23/2016    History reviewed. No pertinent surgical history.      Home Medications    Prior to Admission medications   Medication Sig Start Date End Date Taking? Authorizing  Provider  olopatadine (PATADAY) 0.1 % ophthalmic solution Place 1 drop into both eyes daily as needed for allergies.   Yes [provider]  atorvastatin (LIPITOR) 80 MG tablet Take 1 tablet (80 mg total) by mouth daily at 6 PM. Patient not taking: Reported on 12/16/2018 11/25/16   Marguerita Merles Latif, DO  clopidogrel (PLAVIX) 75 MG tablet Take 1 tablet (75 mg total) by mouth daily. Patient not taking: Reported on 12/16/2018 03/29/17   Chuck Hint, MD  lidocaine (LIDODERM) 5 % Place 1 patch onto the skin daily. Remove & Discard patch within 12 hours or as directed by MD Patient not taking: Reported on 12/16/2018 10/02/18   Gerhard Munch, MD    Family History History reviewed. No pertinent family history.  Social History Social History   Tobacco Use   Smoking status: Current Every Day Smoker   Smokeless tobacco: Never Used   Tobacco comment: 1 pk last 3 days.  Substance Use Topics   Alcohol use: Yes   Drug use: Yes    Types: Marijuana    Comment: occasionally     Allergies   Patient has no known allergies.   Review of Systems Review of Systems  Constitutional: Negative for chills and fever.  HENT: Negative for congestion and facial swelling.   Eyes: Negative for discharge and visual disturbance.  Respiratory: Positive for cough and shortness of breath.  Cardiovascular: Negative for chest pain and palpitations.  Gastrointestinal: Negative for abdominal pain, diarrhea and vomiting.  Musculoskeletal: Negative for arthralgias and myalgias.  Skin: Negative for color change and rash.  Neurological: Positive for weakness. Negative for tremors, syncope and headaches.  Psychiatric/Behavioral: Negative for confusion and dysphoric mood.     Physical Exam Updated Vital Signs BP (!) 165/95    Pulse 81    Temp 97.8 F (36.6 C) (Oral)    Resp 20    Ht 6\' 1"  (1.854 m)    Wt 70.3 kg    SpO2 98%    BMI 20.45 kg/m   Physical Exam Vitals signs and nursing note  reviewed.  Constitutional:      Appearance: He is well-developed.  HENT:     Head: Normocephalic and atraumatic.  Eyes:     Pupils: Pupils are equal, round, and reactive to light.  Neck:     Musculoskeletal: Normal range of motion and neck supple.     Vascular: No JVD.  Cardiovascular:     Rate and Rhythm: Normal rate and regular rhythm.     Heart sounds: No murmur. No friction rub. No gallop.   Pulmonary:     Effort: No respiratory distress.     Breath sounds: No wheezing.  Abdominal:     General: There is no distension.     Tenderness: There is no guarding or rebound.  Musculoskeletal: Normal range of motion.  Skin:    Coloration: Skin is not pale.     Findings: No rash.  Neurological:     Mental Status: He is alert and oriented to person, place, and time.  Psychiatric:        Behavior: Behavior normal.      ED Treatments / Results  Labs (all labs ordered are listed, but only abnormal results are displayed) Labs Reviewed  COMPREHENSIVE METABOLIC PANEL - Abnormal; Notable for the following components:      Result Value   GFR calc non Af Amer 59 (*)    All other components within normal limits  NOVEL CORONAVIRUS, NAA (HOSP ORDER, SEND-OUT TO REF LAB; TAT 18-24 HRS)  CBC WITH DIFFERENTIAL/PLATELET  BRAIN NATRIURETIC PEPTIDE  TROPONIN I (HIGH SENSITIVITY)    EKG EKG Interpretation  Date/Time:  Wednesday December 26 2018 07:07:30 EDT Ventricular Rate:  84 PR Interval:    QRS Duration: 92 QT Interval:  356 QTC Calculation: 421 R Axis:   71 Text Interpretation:  Sinus rhythm Atrial premature complexes LAE, consider biatrial enlargement Probable anteroseptal infarct, old No significant change since last tracing Confirmed by Deno Etienne [phone]) on 12/26/2018 7:33:37 AM   Radiology Dg Chest 2 View  Result Date: 12/26/2018 CLINICAL DATA:  Shortness of breath, weakness EXAM: CHEST - 2 VIEW COMPARISON:  12/16/2018 FINDINGS: There is hyperinflation of the lungs  compatible with COPD. Heart and mediastinal contours are within normal limits. No focal opacities or effusions. No acute bony abnormality. IMPRESSION: COPD.  No active disease. Electronically Signed   By: Rolm Baptise M.D.   On: 12/26/2018 08:07   Ct Angio Chest Pe W And/or Wo Contrast  Result Date: 12/26/2018 CLINICAL DATA:  Shortness of breath EXAM: CT ANGIOGRAPHY CHEST WITH CONTRAST TECHNIQUE: Multidetector CT imaging of the chest was performed using the standard protocol during bolus administration of intravenous contrast. Multiplanar CT image reconstructions and MIPs were obtained to evaluate the vascular anatomy. CONTRAST:  85mL OMNIPAQUE IOHEXOL 350 MG/ML SOLN COMPARISON:  Chest radiograph December 26, 2018. FINDINGS: Cardiovascular:  There is no demonstrable pulmonary embolus. There is no thoracic aortic aneurysm or dissection. There are scattered foci of calcification in the visualized great vessels. There is aortic atherosclerosis. There are foci of coronary artery calcification at several sites. There is no pericardial effusion or pericardial thickening. Mediastinum/Nodes: Visualized thyroid appears unremarkable. There is no appreciable thoracic adenopathy. No esophageal lesions are evident. Lungs/Pleura: There is underlying centrilobular and paraseptal emphysematous change. There is atelectatic change in the inferior lingula and in each lower lobe. There is mild scarring in the right upper lobe medially. There is bullous disease in the left lower lobe. There is no edema or consolidation. No pleural effusion evident. Upper Abdomen: There is a calcification in the superior aspect of the spleen, a probable granuloma. There is also a small granuloma in the right lobe of the liver posteriorly. There is upper abdominal aortic atherosclerosis. Visualized upper abdominal structures otherwise appear unremarkable. Musculoskeletal: There is degenerative change in the thoracic spine. There are no blastic or  lytic bone lesions. There are several old healed rib fractures on the left. No chest wall lesions are evident. Review of the MIP images confirms the above findings. IMPRESSION: 1. No demonstrable pulmonary embolus. No thoracic aortic aneurysm or dissection. There is aortic atherosclerosis as well as foci of great vessel and coronary artery calcification. 2. Underlying emphysematous change. No edema or consolidation. Areas of mild scarring and atelectatic change. 3.  No adenopathy by size criteria. 4.  Evidence of prior granulomatous disease. Aortic Atherosclerosis (ICD10-I70.0) and Emphysema (ICD10-J43.9). Electronically Signed   By: Bretta BangWilliam  Woodruff III M.D.   On: 12/26/2018 10:39    Procedures Procedures (including critical care time)  Medications Ordered in ED Medications  sodium chloride (PF) 0.9 % injection (has no administration in time range)  iohexol (OMNIPAQUE) 350 MG/ML injection 80 mL (80 mLs Intravenous Contrast Given 12/26/18 0931)     Initial Impression / Assessment and Plan / ED Course  I have reviewed the triage vital signs and the nursing notes.  Pertinent labs & imaging results that were available during my care of the patient were reviewed by me and considered in my medical decision making (see chart for details).        78 yo M with a chief complaint of shortness of breath.  Going on for about 10 days now.  Was seen in the ED previously for this.  Patient is well-appearing and nontoxic.  Her percent on room air.  Most likely this is a viral illness.  Will obtain plain film lab work reassess.  As the patient has had persistent symptoms and has clear lungs.  CT scan of the chest is negative for pulmonary embolism.  We will send a second outpatient cover test.  PCP follow-up.  12:45 PM:  I have discussed the diagnosis/risks/treatment options with the patient and believe the pt to be eligible for discharge home to follow-up with PCP. We also discussed returning to the ED  immediately if new or worsening sx occur. We discussed the sx which are most concerning (e.g., sudden worsening pain, fever, inability to tolerate by mouth) that necessitate immediate return. Medications administered to the patient during their visit and any new prescriptions provided to the patient are listed below.  Medications given during this visit Medications  sodium chloride (PF) 0.9 % injection (has no administration in time range)  iohexol (OMNIPAQUE) 350 MG/ML injection 80 mL (80 mLs Intravenous Contrast Given 12/26/18 0931)     The patient appears reasonably screen  and/or stabilized for discharge and I doubt any other medical condition or other [facility] requiring further screening, evaluation, or treatment in the ED at this time prior to discharge.    Final Clinical Impressions(s) / ED Diagnoses   Final diagnoses:  Viral infection    ED Discharge Orders    None       Melene Plan, DO 12/26/18 1245

## 2018-12-26 NOTE — ED Notes (Signed)
Pt request to go to the bathroom. This RN provided urinal and sample in room

## 2018-12-26 NOTE — ED Triage Notes (Signed)
Patient is complaining of shortness of breath and weakness. He was seen for same symptoms on 12/16/2018 and has not improved. Respirations are even, regular, and unlabored. Skin is warm and dry. Patient is ambulatory but has shuffle gait.

## 2018-12-26 NOTE — ED Notes (Signed)
Pt off floor CT

## 2018-12-26 NOTE — ED Notes (Signed)
Pt stated he does not have a mobile or home phone and denied contacting his current listed contact Collie Siad. Pt advised to call [facility] to follow up on results for COVID swab. Verified by ED director

## 2018-12-27 LAB — NOVEL CORONAVIRUS, NAA (HOSP ORDER, SEND-OUT TO REF LAB; TAT 18-24 HRS): SARS-CoV-2, NAA: NOT DETECTED

## 2019-01-05 ENCOUNTER — Emergency Department (HOSPITAL_COMMUNITY)
Admission: EM | Admit: 2019-01-05 | Discharge: 2019-01-05 | Disposition: A | Discharge status: Home / Self Care | Payer: No Typology Code available for payment source | Attending: Emergency Medicine | Admitting: Emergency Medicine

## 2019-01-05 ENCOUNTER — Emergency Department (HOSPITAL_COMMUNITY): Payer: No Typology Code available for payment source

## 2019-01-05 ENCOUNTER — Encounter (HOSPITAL_COMMUNITY): Payer: Self-pay | Admitting: *Deleted

## 2019-01-05 ENCOUNTER — Other Ambulatory Visit: Payer: Self-pay

## 2019-01-05 DIAGNOSIS — Z7901 Long term (current) use of anticoagulants: Secondary | ICD-10-CM | POA: Insufficient documentation

## 2019-01-05 DIAGNOSIS — Z79899 Other long term (current) drug therapy: Secondary | ICD-10-CM | POA: Insufficient documentation

## 2019-01-05 DIAGNOSIS — I10 Essential (primary) hypertension: Secondary | ICD-10-CM | POA: Insufficient documentation

## 2019-01-05 DIAGNOSIS — F1290 Cannabis use, unspecified, uncomplicated: Secondary | ICD-10-CM | POA: Diagnosis not present

## 2019-01-05 DIAGNOSIS — J209 Acute bronchitis, unspecified: Secondary | ICD-10-CM | POA: Insufficient documentation

## 2019-01-05 DIAGNOSIS — F17210 Nicotine dependence, cigarettes, uncomplicated: Secondary | ICD-10-CM | POA: Diagnosis not present

## 2019-01-05 DIAGNOSIS — R0602 Shortness of breath: Secondary | ICD-10-CM | POA: Diagnosis present

## 2019-01-05 LAB — BASIC METABOLIC PANEL
Anion gap: 11 (ref 5–15)
BUN: 15 mg/dL (ref 8–23)
CO2: 25 mmol/L (ref 22–32)
Calcium: 9 mg/dL (ref 8.9–10.3)
Chloride: 104 mmol/L (ref 98–111)
Creatinine, Ser: 1.06 mg/dL (ref 0.61–1.24)
GFR calc Af Amer: >60 mL/min (ref >60)
GFR calc non Af Amer: >60 mL/min (ref >60)
Glucose, Bld: 93 mg/dL (ref 70–99)
Potassium: 4 mmol/L (ref 3.5–5.1)
Sodium: 140 mmol/L (ref 135–145)

## 2019-01-05 LAB — CBC WITH DIFFERENTIAL/PLATELET
Abs Immature Granulocytes: 0.03 10*3/uL (ref 0.00–0.07)
Basophils Absolute: 0.1 10*3/uL (ref 0.0–0.1)
Basophils Relative: 1 %
Eosinophils Absolute: 0.1 10*3/uL (ref 0.0–0.5)
Eosinophils Relative: 1 %
HCT: 46.5 % (ref 39.0–52.0)
Hemoglobin: 15.3 g/dL (ref 13.0–17.0)
Immature Granulocytes: 0 %
Lymphocytes Relative: 24 %
Lymphs Abs: 1.9 10*3/uL (ref 0.7–4.0)
MCH: 29.1 pg (ref 26.0–34.0)
MCHC: 32.9 g/dL (ref 30.0–36.0)
MCV: 88.4 fL (ref 80.0–100.0)
Monocytes Absolute: 0.4 10*3/uL (ref 0.1–1.0)
Monocytes Relative: 5 %
Neutro Abs: 5.4 10*3/uL (ref 1.7–7.7)
Neutrophils Relative %: 69 %
Platelets: 204 10*3/uL (ref 150–400)
RBC: 5.26 MIL/uL (ref 4.22–5.81)
RDW: 13.6 % (ref 11.5–15.5)
WBC: 7.8 10*3/uL (ref 4.0–10.5)
nRBC: 0 % (ref 0.0–0.2)

## 2019-01-05 LAB — TROPONIN I (HIGH SENSITIVITY): Troponin I (High Sensitivity): 11 ng/L (ref <18)

## 2019-01-05 LAB — BRAIN NATRIURETIC PEPTIDE: B Natriuretic Peptide: 93.2 pg/mL (ref 0.0–100.0)

## 2019-01-05 MED ORDER — ALBUTEROL SULFATE HFA 108 (90 BASE) MCG/ACT IN AERS
2.0000 | INHALATION_SPRAY | Freq: Once | RESPIRATORY_TRACT | Status: AC
  Administered 2019-01-05: 21:00:00 2 via RESPIRATORY_TRACT
  Filled 2019-01-05: qty 6.7

## 2019-01-05 MED ORDER — AZITHROMYCIN 250 MG PO TABS: 250.0000 mg | ORAL_TABLET | Freq: Every day | ORAL | 0 refills | Status: AC

## 2019-01-05 MED ORDER — PREDNISONE 10 MG PO TABS: 20.0000 mg | ORAL_TABLET | Freq: Two times a day (BID) | ORAL | 0 refills | Status: AC

## 2019-01-05 NOTE — ED Triage Notes (Signed)
The pt arrived by [address] from the [address]  Pt is homeless this is the 4th time he has   Been here this week  C/o some sob no visible shortness of breath 2 recent negative covid tests

## 2019-01-05 NOTE — ED Provider Notes (Signed)
[facility] EMERGENCY DEPARTMENT Provider Note   CSN: [id number] Arrival date & time: 01/05/19  1810     History   Chief Complaint Chief Complaint  Patient presents with  . Shortness of Breath    HPI Jim Payne is a 78 y.o. male.     Patient is a 78 year old male with past medical history of hypertension, prior CVA, and tobacco abuse.  Patient presents today for evaluation of shortness of breath.  He states that this is been ongoing for the past several weeks.  He tells me he has been seen several times for the same complaint, however no causes been found.  He has been tested on 4 occasions for COVID, however these have been negative.  Patient presents here with ongoing dyspnea.  He denies fevers or chills.  He denies productive cough.  He denies chest pain or swelling in his legs.  The history is provided by the patient.  Shortness of Breath Severity:  Moderate Onset quality:  Gradual Duration:  2 weeks Timing:  Constant Progression:  Worsening Chronicity:  New Relieved by:  Nothing Worsened by:  Nothing Ineffective treatments:  None tried Associated symptoms: no cough, no fever and no sputum production     Past Medical History:  Diagnosis Date  . Stroke [facility])     Patient Active Problem List   Diagnosis Date Noted  . Essential hypertension 02/21/2017  . Mild tetrahydrocannabinol (THC) abuse 02/21/2017  . Cerebrovascular accident (CVA) due to thrombosis of left posterior cerebral artery (HCC) 02/21/2017  . Numbness   . Cocaine abuse (HCC)   . Smoker   . Hyperlipidemia   . Stroke [facility]) 11/23/2016    History reviewed. No pertinent surgical history.      Home Medications    Prior to Admission medications   Medication Sig Start Date End Date Taking? Authorizing Provider  atorvastatin (LIPITOR) 80 MG tablet Take 1 tablet (80 mg total) by mouth daily at 6 PM. Patient not taking: Reported on 12/16/2018 11/25/16   Marguerita Merles Latif, DO   clopidogrel (PLAVIX) 75 MG tablet Take 1 tablet (75 mg total) by mouth daily. Patient not taking: Reported on 12/16/2018 03/29/17   Chuck Hint, MD  lidocaine (LIDODERM) 5 % Place 1 patch onto the skin daily. Remove & Discard patch within 12 hours or as directed by MD Patient not taking: Reported on 12/16/2018 10/02/18   Gerhard Munch, MD  olopatadine (PATADAY) 0.1 % ophthalmic solution Place 1 drop into both eyes daily as needed for allergies.    [provider]    Family History No family history on file.  Social History Social History   Tobacco Use  . Smoking status: Current Every Day Smoker  . Smokeless tobacco: Never Used  . Tobacco comment: 1 pk last 3 days.  Substance Use Topics  . Alcohol use: Yes  . Drug use: Yes    Types: Marijuana    Comment: occasionally     Allergies   Patient has no known allergies.   Review of Systems Review of Systems  Constitutional: Negative for fever.  Respiratory: Positive for shortness of breath. Negative for cough and sputum production.   All other systems reviewed and are negative.    Physical Exam Updated Vital Signs BP (!) 180/113 (BP Location: Right Arm)   Pulse 83   Temp 98.1 F (36.7 C) (Oral)   Resp 16   Ht 6' (1.829 m)   Wt 68 kg   SpO2 99%  BMI 20.34 kg/m   Physical Exam Vitals signs and nursing note reviewed.  Constitutional:      General: He is not in acute distress.    Appearance: He is well-developed. He is not diaphoretic.  HENT:     Head: Normocephalic and atraumatic.  Neck:     Musculoskeletal: Normal range of motion and neck supple.  Cardiovascular:     Rate and Rhythm: Normal rate and regular rhythm.     Heart sounds: No murmur. No friction rub.  Pulmonary:     Effort: Pulmonary effort is normal. No respiratory distress.     Breath sounds: Normal breath sounds. No wheezing or rales.  Abdominal:     General: Bowel sounds are normal. There is no distension.     Palpations:  Abdomen is soft.     Tenderness: There is no abdominal tenderness.  Musculoskeletal: Normal range of motion.     Right lower leg: He exhibits no tenderness. No edema.     Left lower leg: He exhibits no tenderness. No edema.  Skin:    General: Skin is warm and dry.  Neurological:     Mental Status: He is alert and oriented to person, place, and time.     Coordination: Coordination normal.      ED Treatments / Results  Labs (all labs ordered are listed, but only abnormal results are displayed) Labs Reviewed  BASIC METABOLIC PANEL  CBC WITH DIFFERENTIAL/PLATELET  BRAIN NATRIURETIC PEPTIDE  TROPONIN I (HIGH SENSITIVITY)    EKG ED ECG REPORT   Date: 01/05/2019  Rate: 75  Rhythm: normal sinus rhythm with pac's  QRS Axis: normal  Intervals: normal  ST/T Wave abnormalities: normal  Conduction Disutrbances:none  Narrative Interpretation:   Old EKG Reviewed: none available  I have personally reviewed the EKG tracing and agree with the computerized printout as noted.   Radiology No results found.  Procedures Procedures (including critical care time)  Medications Ordered in ED Medications - No data to display   Initial Impression / Assessment and Plan / ED Course  I have reviewed the triage vital signs and the nursing notes.  Pertinent labs & imaging results that were available during my care of the patient were reviewed by me and considered in my medical decision making (see chart for details).  Patient presenting here with complaints of shortness of breath that has been present for the past 3 weeks.  He has several Covid tests and workup with no cause found.  This evening, his labs are unremarkable and chest x-ray is clear.  Will treat with antibiotics, steroids, and an albuterol mdi.  Final Clinical Impressions(s) / ED Diagnoses   Final diagnoses:  None    ED Discharge Orders    None       Veryl Speak, MD 01/05/19 2037

## 2019-01-05 NOTE — Discharge Instructions (Signed)
Begin taking zithromax and prednisone as prescribed this evening.  Use the albuterol inhaler, two puffs every four hours as needed for wheezing or trouble breathing.  Return to the ER if symptoms significantly worsen or change.

## 2019-02-16 ENCOUNTER — Other Ambulatory Visit: Payer: Self-pay

## 2019-02-16 ENCOUNTER — Emergency Department (HOSPITAL_COMMUNITY)
Admission: EM | Admit: 2019-02-16 | Discharge: 2019-02-17 | Disposition: A | Discharge status: Home / Self Care | Payer: No Typology Code available for payment source | Attending: Emergency Medicine | Admitting: Emergency Medicine

## 2019-02-16 DIAGNOSIS — I10 Essential (primary) hypertension: Secondary | ICD-10-CM | POA: Diagnosis not present

## 2019-02-16 DIAGNOSIS — N3000 Acute cystitis without hematuria: Secondary | ICD-10-CM | POA: Diagnosis not present

## 2019-02-16 DIAGNOSIS — F17210 Nicotine dependence, cigarettes, uncomplicated: Secondary | ICD-10-CM | POA: Insufficient documentation

## 2019-02-16 DIAGNOSIS — R350 Frequency of micturition: Secondary | ICD-10-CM | POA: Diagnosis present

## 2019-02-16 DIAGNOSIS — R609 Edema, unspecified: Secondary | ICD-10-CM

## 2019-02-16 DIAGNOSIS — R600 Localized edema: Secondary | ICD-10-CM

## 2019-02-16 DIAGNOSIS — Z79899 Other long term (current) drug therapy: Secondary | ICD-10-CM | POA: Diagnosis not present

## 2019-02-16 DIAGNOSIS — N390 Urinary tract infection, site not specified: Secondary | ICD-10-CM

## 2019-02-16 LAB — BASIC METABOLIC PANEL
Anion gap: 10 (ref 5–15)
BUN: 19 mg/dL (ref 8–23)
CO2: 26 mmol/L (ref 22–32)
Calcium: 9.1 mg/dL (ref 8.9–10.3)
Chloride: 101 mmol/L (ref 98–111)
Creatinine, Ser: 1.32 mg/dL — ABNORMAL HIGH (ref 0.61–1.24)
GFR calc Af Amer: >60 mL/min (ref >60)
GFR calc non Af Amer: 52 mL/min — ABNORMAL LOW (ref >60)
Glucose, Bld: 90 mg/dL (ref 70–99)
Potassium: 4.1 mmol/L (ref 3.5–5.1)
Sodium: 137 mmol/L (ref 135–145)

## 2019-02-16 LAB — COMPREHENSIVE METABOLIC PANEL
ALT: 11 U/L (ref 0–44)
AST: 18 U/L (ref 15–41)
Albumin: 4.4 g/dL (ref 3.5–5.0)
Alkaline Phosphatase: 73 U/L (ref 38–126)
Anion gap: 10 (ref 5–15)
BUN: 19 mg/dL (ref 8–23)
CO2: 26 mmol/L (ref 22–32)
Calcium: 9.1 mg/dL (ref 8.9–10.3)
Chloride: 103 mmol/L (ref 98–111)
Creatinine, Ser: 1.29 mg/dL — ABNORMAL HIGH (ref 0.61–1.24)
GFR calc Af Amer: >60 mL/min (ref >60)
GFR calc non Af Amer: 54 mL/min — ABNORMAL LOW (ref >60)
Glucose, Bld: 86 mg/dL (ref 70–99)
Potassium: 4.2 mmol/L (ref 3.5–5.1)
Sodium: 139 mmol/L (ref 135–145)
Total Bilirubin: 1 mg/dL (ref 0.3–1.2)
Total Protein: 7.4 g/dL (ref 6.5–8.1)

## 2019-02-16 LAB — CBC
HCT: 47.3 % (ref 39.0–52.0)
Hemoglobin: 14.6 g/dL (ref 13.0–17.0)
MCH: 27.8 pg (ref 26.0–34.0)
MCHC: 30.9 g/dL (ref 30.0–36.0)
MCV: 89.9 fL (ref 80.0–100.0)
Platelets: 260 10*3/uL (ref 150–400)
RBC: 5.26 MIL/uL (ref 4.22–5.81)
RDW: 14.6 % (ref 11.5–15.5)
WBC: 8 10*3/uL (ref 4.0–10.5)
nRBC: 0 % (ref 0.0–0.2)

## 2019-02-16 LAB — URINALYSIS, ROUTINE W REFLEX MICROSCOPIC
Bacteria, UA: NONE SEEN
Bilirubin Urine: NEGATIVE
Glucose, UA: NEGATIVE mg/dL
Ketones, ur: 5 mg/dL — AB
Nitrite: NEGATIVE
Protein, ur: 30 mg/dL — AB
Specific Gravity, Urine: 1.02 (ref 1.005–1.030)
pH: 6 (ref 5.0–8.0)

## 2019-02-16 MED ORDER — STERILE WATER FOR INJECTION IJ SOLN
INTRAMUSCULAR | Status: AC
  Administered 2019-02-16: 10 mL via INTRAMUSCULAR
  Filled 2019-02-16: qty 10

## 2019-02-16 MED ORDER — CEFTRIAXONE SODIUM 1 G IJ SOLR
1.0000 g | Freq: Once | INTRAMUSCULAR | Status: AC
  Administered 2019-02-16: 1 g via INTRAMUSCULAR
  Filled 2019-02-16: qty 10

## 2019-02-16 MED ORDER — SODIUM CHLORIDE 0.9 % IV SOLN
1.0000 g | Freq: Once | INTRAVENOUS | Status: DC
  Filled 2019-02-16: qty 10

## 2019-02-16 NOTE — ED Triage Notes (Signed)
Pt arriving POV with complaint of frequent urination and bilateral foot swelling. Pt able to walk but swelling has caused increased difficulty. Pulses present.

## 2019-02-16 NOTE — Discharge Instructions (Addendum)
It was our pleasure to provide your ER care today - we hope that you feel better.  The lab work shows a urine infection - take antibiotic as prescribed, and follow up with primary care doctor in the coming week.  Also have your blood pressure rechecked then, as it is high today.   For swelling, return tomorrow AM for vascular dopplers to make sure there is no blood clot.  Elevate legs, limit salt intake. Follow up with primary care doctor in the coming week.  Return to ER right away if worse, new symptoms, high fevers, persistent vomiting, weak/faint, chest pain, trouble breathing, or other concern.

## 2019-02-16 NOTE — ED Provider Notes (Signed)
[place] DEPT Provider Note   CSN: [id number] Arrival date & time: 02/16/19  1828     History   Chief Complaint Chief Complaint  Patient presents with  . Urinary Frequency  . Foot Swelling    HPI Jim Payne is a 78 y.o. male.     Patient with hx htn, c/o urinary urgency, dysuria, frequency for past few days. Symptoms acute onset, moderate, persistent, dull. No abd or flank pain. No fever or chills. No testicular or scrotal pain or swelling. No hx uti. Denies penile discharge, or known std exposure. Also notes bil foot/ankle swelling in the past few weeks. Symptoms gradual onset, mild-moderate, persistent. No leg or calf pain or swelling. No hx dvt or pe. No chest pain or sob. No orthopnea or pnd. Normal appetite. States does not feel sick or ill. No nv.   The history is provided by the patient.  Urinary Frequency Pertinent negatives include no chest pain, no abdominal pain, no headaches and no shortness of breath.    Past Medical History:  Diagnosis Date  . Stroke [facility])     Patient Active Problem List   Diagnosis Date Noted  . Essential hypertension 02/21/2017  . Mild tetrahydrocannabinol (THC) abuse 02/21/2017  . Cerebrovascular accident (CVA) due to thrombosis of left posterior cerebral artery ([place]) 02/21/2017  . Numbness   . Cocaine abuse ([place])   . Smoker   . Hyperlipidemia   . Stroke ([place]) 11/23/2016    No past surgical history on file.      Home Medications    Prior to Admission medications   Medication Sig Start Date End Date Taking? Authorizing Provider  atorvastatin (LIPITOR) 80 MG tablet Take 1 tablet (80 mg total) by mouth daily at 6 PM. Patient not taking: Reported on 12/16/2018 11/25/16   Raiford Noble Latif, DO  azithromycin (ZITHROMAX) 250 MG tablet Take 1 tablet (250 mg total) by mouth daily. Take first 2 tablets together, then 1 every day until finished. 01/05/19   Veryl Speak, MD  clopidogrel (PLAVIX) 75 MG  tablet Take 1 tablet (75 mg total) by mouth daily. Patient not taking: Reported on 12/16/2018 03/29/17   Angelia Mould, MD  lidocaine (LIDODERM) 5 % Place 1 patch onto the skin daily. Remove & Discard patch within 12 hours or as directed by MD Patient not taking: Reported on 12/16/2018 10/02/18   Carmin Muskrat, MD  olopatadine (PATADAY) 0.1 % ophthalmic solution Place 1 drop into both eyes daily as needed for allergies.    [provider]  predniSONE (DELTASONE) 10 MG tablet Take 2 tablets (20 mg total) by mouth 2 (two) times daily with a meal. 01/05/19   Veryl Speak, MD    Family History No family history on file.  Social History Social History   Tobacco Use  . Smoking status: Current Every Day Smoker  . Smokeless tobacco: Never Used  . Tobacco comment: 1 pk last 3 days.  Substance Use Topics  . Alcohol use: Yes  . Drug use: Yes    Types: Marijuana    Comment: occasionally     Allergies   Patient has no known allergies.   Review of Systems Review of Systems  Constitutional: Negative for fever.  HENT: Negative for sore throat.   Eyes: Negative for redness.  Respiratory: Negative for shortness of breath.   Cardiovascular: Negative for chest pain.  Gastrointestinal: Negative for abdominal pain, nausea and vomiting.  Genitourinary: Positive for dysuria and frequency. Negative for  flank pain, scrotal swelling and testicular pain.  Musculoskeletal: Negative for back pain and neck pain.       Bil foot/ankle swelling.   Skin: Negative for rash.  Neurological: Negative for headaches.  Hematological: Does not bruise/bleed easily.  Psychiatric/Behavioral: Negative for confusion.     Physical Exam Updated Vital Signs BP (!) 158/89   Pulse 65   Temp (!) 97.4 F (36.3 C) (Oral)   Resp 16   Ht 1.854 m (6\' 1" )   Wt 68 kg   SpO2 100%   BMI 19.79 kg/m   Physical Exam Vitals signs and nursing note reviewed.  Constitutional:      Appearance: Normal  appearance. He is well-developed.  HENT:     Head: Atraumatic.     Nose: Nose normal.     Mouth/Throat:     Mouth: Mucous membranes are moist.     Pharynx: Oropharynx is clear.  Eyes:     General: No scleral icterus.    Conjunctiva/sclera: Conjunctivae normal.  Neck:     Musculoskeletal: Normal range of motion and neck supple. No neck rigidity.     Trachea: No tracheal deviation.     Comments: No jvd.  Cardiovascular:     Rate and Rhythm: Normal rate and regular rhythm.     Pulses: Normal pulses.     Heart sounds: Normal heart sounds. No murmur. No friction rub. No gallop.   Pulmonary:     Effort: Pulmonary effort is normal. No accessory muscle usage or respiratory distress.     Breath sounds: Normal breath sounds.  Abdominal:     General: Bowel sounds are normal. There is no distension.     Palpations: Abdomen is soft.     Tenderness: There is no abdominal tenderness. There is no guarding.  Genitourinary:    Comments: No cva tenderness. No penile discharge. No scrotal or testicular pain, swelling or tenderness.  Musculoskeletal:     Comments: Mild-mod bilateral foot and ankle edema. Symmetric. Distal pulses palp bil. No unilateral leg pain or swelling. No calf pain or swelling.   Skin:    General: Skin is warm and dry.     Findings: No rash.  Neurological:     Mental Status: He is alert.     Comments: Alert, speech clear.   Psychiatric:        Mood and Affect: Mood normal.      ED Treatments / Results  Labs (all labs ordered are listed, but only abnormal results are displayed) Results for orders placed or performed during the hospital encounter of 02/16/19  Urinalysis, Routine w reflex microscopic- may I&O cath if menses  Result Value Ref Range   Color, Urine YELLOW YELLOW   APPearance CLEAR CLEAR   Specific Gravity, Urine 1.020 1.005 - 1.030   pH 6.0 5.0 - 8.0   Glucose, UA NEGATIVE NEGATIVE mg/dL   Hgb urine dipstick SMALL (A) NEGATIVE   Bilirubin Urine  NEGATIVE NEGATIVE   Ketones, ur 5 (A) NEGATIVE mg/dL   Protein, ur 30 (A) NEGATIVE mg/dL   Nitrite NEGATIVE NEGATIVE   Leukocytes,Ua TRACE (A) NEGATIVE   RBC / HPF 21-50 0 - 5 RBC/hpf   WBC, UA 21-50 0 - 5 WBC/hpf   Bacteria, UA NONE SEEN NONE SEEN   Squamous Epithelial / LPF 0-5 0 - 5   Mucus PRESENT   Basic metabolic panel  Result Value Ref Range   Sodium 137 135 - 145 mmol/L   Potassium 4.1 3.5 -  5.1 mmol/L   Chloride 101 98 - 111 mmol/L   CO2 26 22 - 32 mmol/L   Glucose, Bld 90 70 - 99 mg/dL   BUN 19 8 - 23 mg/dL   Creatinine, Ser 9.62 (H) 0.61 - 1.24 mg/dL   Calcium 9.1 8.9 - 95.2 mg/dL   GFR calc non Af Amer 52 (L) >60 mL/min   GFR calc Af Amer >60 >60 mL/min   Anion gap 10 5 - 15  CBC  Result Value Ref Range   WBC 8.0 4.0 - 10.5 K/uL   RBC 5.26 4.22 - 5.81 MIL/uL   Hemoglobin 14.6 13.0 - 17.0 g/dL   HCT 84.1 32.4 - 40.1 %   MCV 89.9 80.0 - 100.0 fL   MCH 27.8 26.0 - 34.0 pg   MCHC 30.9 30.0 - 36.0 g/dL   RDW 02.7 25.3 - 66.4 %   Platelets 260 150 - 400 K/uL   nRBC 0.0 0.0 - 0.2 %  CMET  Result Value Ref Range   Sodium 139 135 - 145 mmol/L   Potassium 4.2 3.5 - 5.1 mmol/L   Chloride 103 98 - 111 mmol/L   CO2 26 22 - 32 mmol/L   Glucose, Bld 86 70 - 99 mg/dL   BUN 19 8 - 23 mg/dL   Creatinine, Ser 4.03 (H) 0.61 - 1.24 mg/dL   Calcium 9.1 8.9 - 47.4 mg/dL   Total Protein 7.4 6.5 - 8.1 g/dL   Albumin 4.4 3.5 - 5.0 g/dL   AST 18 15 - 41 U/L   ALT 11 0 - 44 U/L   Alkaline Phosphatase 73 38 - 126 U/L   Total Bilirubin 1.0 0.3 - 1.2 mg/dL   GFR calc non Af Amer 54 (L) >60 mL/min   GFR calc Af Amer >60 >60 mL/min   Anion gap 10 5 - 15    EKG None  Radiology No results found.  Procedures Procedures (including critical care time)  Medications Ordered in ED Medications  cefTRIAXone (ROCEPHIN) 1 g in sodium chloride 0.9 % 100 mL IVPB (has no administration in time range)     Initial Impression / Assessment and Plan / ED Course  I have reviewed the  triage vital signs and the nursing notes.  Pertinent labs & imaging results that were available during my care of the patient were reviewed by me and considered in my medical decision making (see chart for details).  Labs sent.   Reviewed nursing notes and prior charts for additional history.   Labs reviewed/interpreted by me - c/w uti. Urine culture sent. Rocephin iv.   Patient also with bil ankle/foot edema, new.  Suspect idiopathic peripheral edema, multifactorial, ?low albumin -= will check. Given new, and recent relative immobility, will have return in AM for vascular dopplers.   Additional labs return - lfts, albumin normal.   Pt denies sob, no orthopnea, chest cta.   Patient refuses iv in ED, discussed rationale/reasons, tx plan - pt refuses iv, will accept im med, and requests d/c to home.  Discussed need close pcp f/u, as well as return in AM for vascular dopplers.   Return precautions provided.      Final Clinical Impressions(s) / ED Diagnoses   Final diagnoses:  None    ED Discharge Orders    None       Cathren Laine, MD 02/16/19 2354

## 2019-02-17 ENCOUNTER — Emergency Department (HOSPITAL_COMMUNITY)
Admission: EM | Admit: 2019-02-17 | Discharge: 2019-02-17 | Discharge status: Absent without leave | Payer: No Typology Code available for payment source | Source: Home / Self Care

## 2019-02-17 ENCOUNTER — Ambulatory Visit (HOSPITAL_COMMUNITY): Payer: No Typology Code available for payment source

## 2019-02-17 MED ORDER — CEPHALEXIN 500 MG PO CAPS: 1000.0000 mg | ORAL_CAPSULE | Freq: Two times a day (BID) | ORAL | 0 refills | Status: AC

## 2019-02-18 ENCOUNTER — Ambulatory Visit (HOSPITAL_COMMUNITY): Payer: No Typology Code available for payment source | Attending: Emergency Medicine

## 2019-05-29 NOTE — Telephone Encounter (Signed)
error 

## 2019-12-28 ENCOUNTER — Emergency Department (HOSPITAL_COMMUNITY)
Admission: EM | Admit: 2019-12-28 | Discharge: 2019-12-28 | Disposition: A | Discharge status: Home / Self Care | Payer: No Typology Code available for payment source | Attending: Emergency Medicine | Admitting: Emergency Medicine

## 2019-12-28 ENCOUNTER — Other Ambulatory Visit: Payer: Self-pay

## 2019-12-28 ENCOUNTER — Encounter (HOSPITAL_COMMUNITY): Payer: Self-pay

## 2019-12-28 DIAGNOSIS — I10 Essential (primary) hypertension: Secondary | ICD-10-CM | POA: Diagnosis not present

## 2019-12-28 DIAGNOSIS — R1030 Lower abdominal pain, unspecified: Secondary | ICD-10-CM | POA: Diagnosis present

## 2019-12-28 DIAGNOSIS — F17200 Nicotine dependence, unspecified, uncomplicated: Secondary | ICD-10-CM | POA: Insufficient documentation

## 2019-12-28 DIAGNOSIS — Z79899 Other long term (current) drug therapy: Secondary | ICD-10-CM | POA: Insufficient documentation

## 2019-12-28 DIAGNOSIS — F1590 Other stimulant use, unspecified, uncomplicated: Secondary | ICD-10-CM | POA: Diagnosis not present

## 2019-12-28 DIAGNOSIS — R350 Frequency of micturition: Secondary | ICD-10-CM

## 2019-12-28 DIAGNOSIS — R1084 Generalized abdominal pain: Secondary | ICD-10-CM

## 2019-12-28 DIAGNOSIS — K5900 Constipation, unspecified: Secondary | ICD-10-CM | POA: Diagnosis not present

## 2019-12-28 LAB — URINALYSIS, ROUTINE W REFLEX MICROSCOPIC
Bilirubin Urine: NEGATIVE
Glucose, UA: NEGATIVE mg/dL
Ketones, ur: NEGATIVE mg/dL
Leukocytes,Ua: NEGATIVE
Nitrite: NEGATIVE
Protein, ur: NEGATIVE mg/dL
Specific Gravity, Urine: 1.025 (ref 1.005–1.030)
pH: 6 (ref 5.0–8.0)

## 2019-12-28 LAB — URINALYSIS, MICROSCOPIC (REFLEX): Squamous Epithelial / HPF: NONE SEEN (ref 0–5)

## 2019-12-28 MED ORDER — METHOCARBAMOL 500 MG PO TABS: 500.0000 mg | ORAL_TABLET | Freq: Two times a day (BID) | ORAL | 0 refills | Status: AC | PRN
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
## 2019-12-28 NOTE — Discharge Instructions (Addendum)
Continue taking home medications as prescribed. Continue using Aleve as needed for pain.  You may also supplement with Tylenol as needed. Use muscle relaxer as needed for severe worsening pain.  Have caution, as this may make you tired or groggy.  Do not drive or operate machinery while taking his medicine. Make sure staying well-hydrated water. Eat a high-fiber diet. If you need to, continue using MiraLAX daily until you are having regular bowel movements. Follow-up with your primary care doctor for recheck of your symptoms in 1 week. Return to the emergency room if you develop severe worsening pain, vomiting, inability to pass gas or have a bowel movement, or with any new, worsening, or concerning symptoms.
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
## 2020-02-05 ENCOUNTER — Emergency Department (HOSPITAL_COMMUNITY): Payer: No Typology Code available for payment source

## 2020-02-05 ENCOUNTER — Other Ambulatory Visit: Payer: Self-pay

## 2020-02-05 ENCOUNTER — Emergency Department (HOSPITAL_COMMUNITY)
Admission: EM | Admit: 2020-02-05 | Discharge: 2020-02-05 | Disposition: A | Discharge status: Home / Self Care | Payer: No Typology Code available for payment source | Attending: Emergency Medicine | Admitting: Emergency Medicine

## 2020-02-05 DIAGNOSIS — R519 Headache, unspecified: Secondary | ICD-10-CM | POA: Diagnosis present

## 2020-02-05 DIAGNOSIS — R410 Disorientation, unspecified: Secondary | ICD-10-CM | POA: Diagnosis not present

## 2020-02-05 DIAGNOSIS — M542 Cervicalgia: Secondary | ICD-10-CM | POA: Diagnosis not present

## 2020-02-05 LAB — CBC
HCT: 43.3 % (ref 39.0–52.0)
Hemoglobin: 13.7 g/dL (ref 13.0–17.0)
MCH: 28.3 pg (ref 26.0–34.0)
MCHC: 31.6 g/dL (ref 30.0–36.0)
MCV: 89.5 fL (ref 80.0–100.0)
Platelets: 227 10*3/uL (ref 150–400)
RBC: 4.84 MIL/uL (ref 4.22–5.81)
RDW: 13.7 % (ref 11.5–15.5)
WBC: 8.1 10*3/uL (ref 4.0–10.5)
nRBC: 0 % (ref 0.0–0.2)

## 2020-02-05 LAB — COMPREHENSIVE METABOLIC PANEL
ALT: 9 U/L (ref 0–44)
AST: 18 U/L (ref 15–41)
Albumin: 4 g/dL (ref 3.5–5.0)
Alkaline Phosphatase: 69 U/L (ref 38–126)
Anion gap: 8 (ref 5–15)
BUN: 18 mg/dL (ref 8–23)
CO2: 29 mmol/L (ref 22–32)
Calcium: 9 mg/dL (ref 8.9–10.3)
Chloride: 102 mmol/L (ref 98–111)
Creatinine, Ser: 1.29 mg/dL — ABNORMAL HIGH (ref 0.61–1.24)
GFR, Estimated: 57 mL/min — ABNORMAL LOW (ref >60)
Glucose, Bld: 107 mg/dL — ABNORMAL HIGH (ref 70–99)
Potassium: 4.5 mmol/L (ref 3.5–5.1)
Sodium: 139 mmol/L (ref 135–145)
Total Bilirubin: 1.2 mg/dL (ref 0.3–1.2)
Total Protein: 6.6 g/dL (ref 6.5–8.1)

## 2020-02-05 LAB — CBG MONITORING, ED: Glucose-Capillary: 101 mg/dL — ABNORMAL HIGH (ref 70–99)

## 2020-02-05 NOTE — Discharge Instructions (Signed)
Your work-up today was overall reassuring.  Please follow-up with your PCP.  If any symptoms change or worsen, please return to the nearest emergency department.
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
## 2020-02-05 NOTE — ED Notes (Addendum)
Patient verbalizes understanding of discharge instructions. Opportunity for questioning and answers were provided. Armband removed by staff, pt discharged from ED. Taxi called for patient back to motel.

## 2020-10-26 DEATH — deceased
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
